# Patient Record
Sex: Male | Born: 1975 | Race: Black or African American | Hispanic: No | Marital: Married | Smoking: Never smoker
Health system: Southern US, Community
[De-identification: ages and names within clinical notes are randomized; demographics above are authoritative.]

## PROBLEM LIST (undated history)

## (undated) DIAGNOSIS — U071 COVID-19: Secondary | ICD-10-CM

## (undated) DIAGNOSIS — T7840XA Allergy, unspecified, initial encounter: Secondary | ICD-10-CM

## (undated) DIAGNOSIS — Z8619 Personal history of other infectious and parasitic diseases: Secondary | ICD-10-CM

## (undated) DIAGNOSIS — I10 Essential (primary) hypertension: Secondary | ICD-10-CM

## (undated) HISTORY — DX: Allergy, unspecified, initial encounter: T78.40XA

## (undated) HISTORY — DX: Personal history of other infectious and parasitic diseases: Z86.19

## (undated) HISTORY — DX: Essential (primary) hypertension: I10

## (undated) HISTORY — DX: COVID-19: U07.1

## (undated) HISTORY — PX: OTHER SURGICAL HISTORY: SHX169

---

## 2006-03-04 DIAGNOSIS — L0293 Carbuncle, unspecified: Secondary | ICD-10-CM | POA: Insufficient documentation

## 2006-03-04 DIAGNOSIS — R030 Elevated blood-pressure reading, without diagnosis of hypertension: Secondary | ICD-10-CM | POA: Insufficient documentation

## 2006-04-18 DIAGNOSIS — M25529 Pain in unspecified elbow: Secondary | ICD-10-CM | POA: Insufficient documentation

## 2006-07-14 DIAGNOSIS — N390 Urinary tract infection, site not specified: Secondary | ICD-10-CM | POA: Insufficient documentation

## 2006-07-14 DIAGNOSIS — R300 Dysuria: Secondary | ICD-10-CM | POA: Insufficient documentation

## 2007-03-08 DIAGNOSIS — R5381 Other malaise: Secondary | ICD-10-CM | POA: Insufficient documentation

## 2007-07-14 DIAGNOSIS — J019 Acute sinusitis, unspecified: Secondary | ICD-10-CM | POA: Insufficient documentation

## 2007-07-14 DIAGNOSIS — H6980 Other specified disorders of Eustachian tube, unspecified ear: Secondary | ICD-10-CM | POA: Insufficient documentation

## 2007-07-14 DIAGNOSIS — H6990 Unspecified Eustachian tube disorder, unspecified ear: Secondary | ICD-10-CM | POA: Insufficient documentation

## 2018-04-04 ENCOUNTER — Encounter: Payer: Self-pay | Admitting: Internal Medicine

## 2018-04-04 ENCOUNTER — Ambulatory Visit: Payer: BLUE CROSS/BLUE SHIELD | Admitting: Internal Medicine

## 2018-04-04 ENCOUNTER — Ambulatory Visit (INDEPENDENT_AMBULATORY_CARE_PROVIDER_SITE_OTHER): Payer: BLUE CROSS/BLUE SHIELD

## 2018-04-04 VITALS — BP 140/90 | HR 74 | Temp 98.1°F | Ht 73.0 in | Wt >= 6400 oz

## 2018-04-04 DIAGNOSIS — Z1329 Encounter for screening for other suspected endocrine disorder: Secondary | ICD-10-CM

## 2018-04-04 DIAGNOSIS — M25572 Pain in left ankle and joints of left foot: Secondary | ICD-10-CM | POA: Insufficient documentation

## 2018-04-04 DIAGNOSIS — E662 Morbid (severe) obesity with alveolar hypoventilation: Secondary | ICD-10-CM | POA: Insufficient documentation

## 2018-04-04 DIAGNOSIS — Z0184 Encounter for antibody response examination: Secondary | ICD-10-CM

## 2018-04-04 DIAGNOSIS — Z1389 Encounter for screening for other disorder: Secondary | ICD-10-CM

## 2018-04-04 DIAGNOSIS — R0683 Snoring: Secondary | ICD-10-CM | POA: Diagnosis not present

## 2018-04-04 DIAGNOSIS — I8393 Asymptomatic varicose veins of bilateral lower extremities: Secondary | ICD-10-CM

## 2018-04-04 DIAGNOSIS — R600 Localized edema: Secondary | ICD-10-CM

## 2018-04-04 DIAGNOSIS — I10 Essential (primary) hypertension: Secondary | ICD-10-CM

## 2018-04-04 DIAGNOSIS — R739 Hyperglycemia, unspecified: Secondary | ICD-10-CM

## 2018-04-04 DIAGNOSIS — Z1159 Encounter for screening for other viral diseases: Secondary | ICD-10-CM

## 2018-04-04 DIAGNOSIS — E559 Vitamin D deficiency, unspecified: Secondary | ICD-10-CM

## 2018-04-04 MED ORDER — PHENTERMINE HCL 37.5 MG PO TABS: 37.5000 mg | ORAL_TABLET | Freq: Every day | ORAL | 0 refills | Status: DC

## 2018-04-04 MED ORDER — HYDROCHLOROTHIAZIDE 12.5 MG PO TABS: 12.5000 mg | ORAL_TABLET | Freq: Every day | ORAL | 0 refills | Status: DC

## 2018-04-04 NOTE — Progress Notes (Signed)
Chief Complaint  Patient presents with  . Annual Exam   Establish care with wife Satara  1. HTN not on meds x 20 years 2. Morbid obesity trying to exercise and eat right but gained about 50 lbs wants to be on adipex again not ready for wt loss surgery  3. Snoring at night and falling asleep as soon as gets home in the pm hard to arouse when asleep 4. Left ankle pain medial no h/o trauma; worse with inversion. He is able to weight bear tried tylenol, ice allergic to nsaids some swelling in ankle as well  5. Varicose veins chronic L>R leg nothing tried    Review of Systems  Constitutional: Negative for weight loss.  HENT: Negative for hearing loss.   Eyes: Negative for blurred vision.  Respiratory: Negative for shortness of breath.   Cardiovascular: Negative for chest pain.  Gastrointestinal: Negative for abdominal pain.  Musculoskeletal: Positive for joint pain.  Skin: Negative for rash.  Neurological: Negative for headaches.  Psychiatric/Behavioral: Negative for depression.   Past Medical History:  Diagnosis Date  . Allergy   . History of chicken pox   . Hypertension    tx'ed in 20s off meds since    Past Surgical History:  Procedure Laterality Date  . left knee arthroscopy     high school    Family History  Problem Relation Age of Onset  . Diabetes Father   . Heart disease Sister        valvular heart disease  . Diabetes Paternal Grandmother   . Hypertension Paternal Grandmother   . Heart disease Paternal Grandmother        valvular heart disease died in late 62s   . Diabetes Paternal Grandfather   . Hypertension Paternal Grandfather    Social History   Socioeconomic History  . Marital status: Married    Spouse name: Not on file  . Number of children: Not on file  . Years of education: Not on file  . Highest education level: Not on file  Occupational History  . Not on file  Social Needs  . Financial resource strain: Not on file  . Food insecurity:   Worry: Not on file    Inability: Not on file  . Transportation needs:    Medical: Not on file    Non-medical: Not on file  Tobacco Use  . Smoking status: Never Smoker  . Smokeless tobacco: Never Used  Substance and Sexual Activity  . Alcohol use: Yes  . Drug use: Not Currently  . Sexual activity: Yes  Lifestyle  . Physical activity:    Days per week: Not on file    Minutes per session: Not on file  . Stress: Not on file  Relationships  . Social connections:    Talks on phone: Not on file    Gets together: Not on file    Attends religious service: Not on file    Active member of club or organization: Not on file    Attends meetings of clubs or organizations: Not on file    Relationship status: Not on file  . Intimate partner violence:    Fear of current or ex partner: Not on file    Emotionally abused: Not on file    Physically abused: Not on file    Forced sexual activity: Not on file  Other Topics Concern  . 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   Allergies  Allergen Reactions  . Nsaids     Hives    No results found for this or any previous visit (from the past 2160 hour(s)). Objective  Body mass index is 53.88 kg/m. Wt Readings from Last 3 Encounters:  04/04/18 (!) 408 lb 6.4 oz (185.2 kg)   Temp Readings from Last 3 Encounters:  04/04/18 98.1 F (36.7 C) (Oral)   BP Readings from Last 3 Encounters:  04/04/18 140/90   Pulse Readings from Last 3 Encounters:  04/04/18 74    Physical Exam Vitals signs and nursing note reviewed.  Constitutional:      Appearance: Normal appearance. He is well-developed and well-groomed. He is morbidly  obese.  HENT:     Head: Normocephalic and atraumatic.     Nose: Nose normal.     Mouth/Throat:     Mouth: Mucous membranes are moist.     Pharynx: Oropharynx is clear.  Eyes:     Conjunctiva/sclera: Conjunctivae normal.     Pupils: Pupils are equal, round, and reactive to light.  Cardiovascular:     Rate and Rhythm: Normal rate and regular rhythm.     Heart sounds: Normal heart sounds. No murmur.     Comments: 1-2 + left lower ext edema   Pulmonary:     Effort: Pulmonary effort is normal.     Breath sounds: Normal breath sounds.  Musculoskeletal:     Left lower leg: Edema present.  Skin:    General: Skin is warm and dry.     Comments: Varicose veins b/l legs   Neurological:     General: No focal deficit present.     Mental Status: He is alert and oriented to person, place, and time. Mental status is at baseline.     Gait: Gait normal.  Psychiatric:        Attention and Perception: Attention and perception normal.        Mood and Affect: Mood and affect normal.        Speech: Speech normal.        Behavior: Behavior normal. Behavior is cooperative.        Thought Content: Thought content normal.        Cognition and Memory: Cognition and memory normal.        Judgment: Judgment normal.     Assessment   1. HTN  2. Morbid obesity BMI 53.88  3. Snoring  4. Left ankle pain medial no h/o trauma; worse with inversion  5. Varicose veins  6. HM Plan   1. Get echo prior PCP had done [facility] in 2018  sch fasting labs  hctz 12.5 mg qd consider add losartan in future  2. adipex 37.5 x 2 months and f/u  3. Consider sleep study in future  4. Xray RICE Consider MRI if pain continues  5. Disc otc compression stockings  6.  Declines flu shot for now had 11/29/16  Tdap 06/2016 he thinks get report [place]   sch fasting labs annual at f/u   Eye exam had 2019  Dentist Dr. Chrisandra Carota  PCP Woody Seller saw 03/2017 Shelly Bombard  Provider: Dr. Olivia Mackie  [facility]

## 2018-04-04 NOTE — Progress Notes (Signed)
Pre visit review using our clinic review tool, if applicable. No additional management support is needed unless otherwise documented below in the visit note. 

## 2018-04-04 NOTE — Patient Instructions (Addendum)
Exercising to Lose Weight Exercise is structured, repetitive physical activity to improve fitness and health. Getting regular exercise is important for everyone. It is especially important if you are overweight. Being overweight increases your risk of heart disease, stroke, diabetes, high blood pressure, and several types of cancer. Reducing your calorie intake and exercising can help you lose weight. Exercise is usually categorized as moderate or vigorous intensity. To lose weight, most people need to do a certain amount of moderate-intensity or vigorous-intensity exercise each week. Moderate-intensity exercise  Moderate-intensity exercise is any activity that gets you moving enough to burn at least three times more energy (calories) than if you were sitting. Examples of moderate exercise include:  Walking a mile in 15 minutes.  Doing light yard work.  Biking at an easy pace. Most people should get at least 150 minutes (2 hours and 30 minutes) a week of moderate-intensity exercise to maintain their body weight. Vigorous-intensity exercise Vigorous-intensity exercise is any activity that gets you moving enough to burn at least six times more calories than if you were sitting. When you exercise at this intensity, you should be working hard enough that you are not able to carry on a conversation. Examples of vigorous exercise include:  Running.  Playing a team sport, such as football, basketball, and soccer.  Jumping rope. Most people should get at least 75 minutes (1 hour and 15 minutes) a week of vigorous-intensity exercise to maintain their body weight. How can exercise affect me? When you exercise enough to burn more calories than you eat, you lose weight. Exercise also reduces body fat and builds muscle. The more muscle you have, the more calories you burn. Exercise also:  Improves mood.  Reduces stress and tension.  Improves your overall fitness, flexibility, and  endurance.  Increases bone strength. The amount of exercise you need to lose weight depends on:  Your age.  The type of exercise.  Any health conditions you have.  Your overall physical ability. Talk to your health care provider about how much exercise you need and what types of activities are safe for you. What actions can I take to lose weight? Nutrition   Make changes to your diet as told by your health care provider or diet and nutrition specialist (dietitian). This may include: ? Eating fewer calories. ? Eating more protein. ? Eating less unhealthy fats. ? Eating a diet that includes fresh fruits and vegetables, whole grains, low-fat dairy products, and lean protein. ? Avoiding foods with added fat, salt, and sugar.  Drink plenty of water while you exercise to prevent dehydration or heat stroke. Activity  Choose an activity that you enjoy and set realistic goals. Your health care provider can help you make an exercise plan that works for you.  Exercise at a moderate or vigorous intensity most days of the week. ? The intensity of exercise may vary from person to person. You can tell how intense a workout is for you by paying attention to your breathing and heartbeat. Most people will notice their breathing and heartbeat get faster with more intense exercise.  Do resistance training twice each week, such as: ? Push-ups. ? Sit-ups. ? Lifting weights. ? Using resistance bands.  Getting short amounts of exercise can be just as helpful as long structured periods of exercise. If you have trouble finding time to exercise, try to include exercise in your daily routine. ? Get up, stretch, and walk around every 30 minutes throughout the day. ? Go for a  walk during your lunch break. ? Park your car farther away from your destination. ? If you take public transportation, get off one stop early and walk the rest of the way. ? Make phone calls while standing up and walking around. ? Take the  stairs instead of elevators or escalators.  Wear comfortable clothes and shoes with good support.  Do not exercise so much that you hurt yourself, feel dizzy, or get very short of breath. Where to find more information  [place] Department of Health and Human Services: www.hhs.gov  Centers for Disease Control and Prevention (CDC): www.cdc.gov Contact a health care provider:  Before starting a new exercise program.  If you have questions or concerns about your weight.  If you have a medical problem that keeps you from exercising. Get help right away if you have any of the following while exercising:  Injury.  Dizziness.  Difficulty breathing or shortness of breath that does not go away when you stop exercising.  Chest pain.  Rapid heartbeat. Summary  Being overweight increases your risk of heart disease, stroke, diabetes, high blood pressure, and several types of cancer.  Losing weight happens when you burn more calories than you eat.  Reducing the amount of calories you eat in addition to getting regular moderate or vigorous exercise each week helps you lose weight. This information is not intended to replace advice given to you by your health care provider. Make sure you discuss any questions you have with your health care provider. Document Released: 03/20/2010 Document Revised: 02/28/2017 Document Reviewed: 02/28/2017 Elsevier Interactive Patient Education  2019 [employer]  DASH Eating Plan DASH stands for "Dietary Approaches to Stop Hypertension." The DASH eating plan is a healthy eating plan that has been shown to reduce high blood pressure (hypertension). It may also reduce your risk for type 2 diabetes, heart disease, and stroke. The DASH eating plan may also help with weight loss. What are tips for following this plan?  General guidelines  Avoid eating more than 2,300 mg (milligrams) of salt (sodium) a day. If you have hypertension, you may need to reduce your  sodium intake to 1,500 mg a day.  Limit alcohol intake to no more than 1 drink a day for nonpregnant women and 2 drinks a day for men. One drink equals 12 oz of beer, 5 oz of wine, or 1 oz of hard liquor.  Work with your health care provider to maintain a healthy body weight or to lose weight. Ask what an ideal weight is for you.  Get at least 30 minutes of exercise that causes your heart to beat faster (aerobic exercise) most days of the week. Activities may include walking, swimming, or biking.  Work with your health care provider or diet and nutrition specialist (dietitian) to adjust your eating plan to your individual calorie needs. Reading food labels   Check food labels for the amount of sodium per serving. Choose foods with less than 5 percent of the Daily Value of sodium. Generally, foods with less than 300 mg of sodium per serving fit into this eating plan.  To find whole grains, look for the word "whole" as the first word in the ingredient list. Shopping  Buy products labeled as "low-sodium" or "no salt added."  Buy fresh foods. Avoid canned foods and premade or frozen meals. Cooking  Avoid adding salt when cooking. Use salt-free seasonings or herbs instead of table salt or sea salt. Check with your health care provider or pharmacist   before using salt substitutes.  Do not fry foods. Cook foods using healthy methods such as baking, boiling, grilling, and broiling instead.  Cook with heart-healthy oils, such as olive, canola, soybean, or sunflower oil. Meal planning  Eat a balanced diet that includes: ? 5 or more servings of fruits and vegetables each day. At each meal, try to fill half of your plate with fruits and vegetables. ? Up to 6-8 servings of whole grains each day. ? Less than 6 oz of lean meat, poultry, or fish each day. A 3-oz serving of meat is about the same size as a deck of cards. One egg equals 1 oz. ? 2 servings of low-fat dairy each day. ? A serving of  nuts, seeds, or beans 5 times each week. ? Heart-healthy fats. Healthy fats called Omega-3 fatty acids are found in foods such as flaxseeds and coldwater fish, like sardines, salmon, and mackerel.  Limit how much you eat of the following: ? Canned or prepackaged foods. ? Food that is high in trans fat, such as fried foods. ? Food that is high in saturated fat, such as fatty meat. ? Sweets, desserts, sugary drinks, and other foods with added sugar. ? Full-fat dairy products.  Do not salt foods before eating.  Try to eat at least 2 vegetarian meals each week.  Eat more home-cooked food and less restaurant, buffet, and fast food.  When eating at a restaurant, ask that your food be prepared with less salt or no salt, if possible. What foods are recommended? The items listed may not be a complete list. Talk with your dietitian about what dietary choices are best for you. Grains Whole-grain or whole-wheat bread. Whole-grain or whole-wheat pasta. Brown rice. Oatmeal. Quinoa. Bulgur. Whole-grain and low-sodium cereals. Pita bread. Low-fat, low-sodium crackers. Whole-wheat flour tortillas. Vegetables Fresh or frozen vegetables (raw, steamed, roasted, or grilled). Low-sodium or reduced-sodium tomato and vegetable juice. Low-sodium or reduced-sodium tomato sauce and tomato paste. Low-sodium or reduced-sodium canned vegetables. Fruits All fresh, dried, or frozen fruit. Canned fruit in natural juice (without added sugar). Meat and other protein foods Skinless chicken or turkey. Ground chicken or turkey. Pork with fat trimmed off. Fish and seafood. Egg whites. Dried beans, peas, or lentils. Unsalted nuts, nut butters, and seeds. Unsalted canned beans. Lean cuts of beef with fat trimmed off. Low-sodium, lean deli meat. Dairy Low-fat (1%) or fat-free (skim) milk. Fat-free, low-fat, or reduced-fat cheeses. Nonfat, low-sodium ricotta or cottage cheese. Low-fat or nonfat yogurt. Low-fat, low-sodium cheese.  Fats and oils Soft margarine without trans fats. Vegetable oil. Low-fat, reduced-fat, or light mayonnaise and salad dressings (reduced-sodium). Canola, safflower, olive, soybean, and sunflower oils. Avocado. Seasoning and other foods Herbs. Spices. Seasoning mixes without salt. Unsalted popcorn and pretzels. Fat-free sweets. What foods are not recommended? The items listed may not be a complete list. Talk with your dietitian about what dietary choices are best for you. Grains Baked goods made with fat, such as croissants, muffins, or some breads. Dry pasta or rice meal packs. Vegetables Creamed or fried vegetables. Vegetables in a cheese sauce. Regular canned vegetables (not low-sodium or reduced-sodium). Regular canned tomato sauce and paste (not low-sodium or reduced-sodium). Regular tomato and vegetable juice (not low-sodium or reduced-sodium). Pickles. Olives. Fruits Canned fruit in a light or heavy syrup. Fried fruit. Fruit in cream or butter sauce. Meat and other protein foods Fatty cuts of meat. Ribs. Fried meat. Bacon. Sausage. Bologna and other processed lunch meats. Salami. Fatback. Hotdogs. Bratwurst. Salted nuts   and seeds. Canned beans with added salt. Canned or smoked fish. Whole eggs or egg yolks. Chicken or [place] with skin. Dairy Whole or 2% milk, cream, and half-and-half. Whole or full-fat cream cheese. Whole-fat or sweetened yogurt. Full-fat cheese. Nondairy creamers. Whipped toppings. Processed cheese and cheese spreads. Fats and oils Butter. Stick margarine. Lard. Shortening. Ghee. Bacon fat. Tropical oils, such as coconut, palm kernel, or palm oil. Seasoning and other foods Salted popcorn and pretzels. Onion salt, garlic salt, seasoned salt, table salt, and sea salt. Worcestershire sauce. Tartar sauce. Barbecue sauce. Teriyaki sauce. Soy sauce, including reduced-sodium. Steak sauce. Canned and packaged gravies. Fish sauce. Oyster sauce. Cocktail  sauce. Horseradish that you find on the shelf. Ketchup. Mustard. Meat flavorings and tenderizers. Bouillon cubes. Hot sauce and Tabasco sauce. Premade or packaged marinades. Premade or packaged taco seasonings. Relishes. Regular salad dressings. Where to find more information:  National Heart, Lung, and [place]: https://wilson-eaton.com/  American Heart Association: www.heart.org Summary  The DASH eating plan is a healthy eating plan that has been shown to reduce high blood pressure (hypertension). It may also reduce your risk for type 2 diabetes, heart disease, and stroke.  With the DASH eating plan, you should limit salt (sodium) intake to 2,300 mg a day. If you have hypertension, you may need to reduce your sodium intake to 1,500 mg a day.  When on the DASH eating plan, aim to eat more fresh fruits and vegetables, whole grains, lean proteins, low-fat dairy, and heart-healthy fats.  Work with your health care provider or diet and nutrition specialist (dietitian) to adjust your eating plan to your individual calorie needs. This information is not intended to replace advice given to you by your health care provider. Make sure you discuss any questions you have with your health care provider. Document Released: 02/04/2011 Document Revised: 02/09/2016 Document Reviewed: 02/09/2016 Elsevier Interactive Patient Education  2019 [employer] Inc.  Varicose Veins Varicose veins are veins that have become enlarged, bulged, and twisted. They most often appear in the legs. What are the causes? This condition is caused by damage to the valves in the vein. These valves help blood return to your heart. When they are damaged and they stop working properly, blood may flow backward and back up in the veins near the skin, causing the veins to get larger and appear twisted. The condition can result from any issue that causes blood to back up, like pregnancy, prolonged standing, or obesity. What increases the  risk? This condition is more likely to develop in people who are:  On their feet a lot.  Pregnant.  Overweight. What are the signs or symptoms? Symptoms of this condition include:  Bulging, twisted, and bluish veins.  A feeling of heaviness. This may be worse at the end of the day.  Leg pain. This may be worse at the end of the day.  Swelling in the leg.  Changes in skin color over the veins. How is this diagnosed? This condition may be diagnosed based on your symptoms, a physical exam, and an ultrasound test. How is this treated? Treatment for this condition may involve:  Avoiding sitting or standing in one position for long periods of time.  Wearing compression stockings. These stockings help to prevent blood clots and reduce swelling in the legs.  Raising (elevating) the legs when resting.  Losing weight.  Exercising regularly. If you have persistent symptoms or want to improve the way your varicose veins look, you may choose to have a  procedure to close the varicose veins off or to remove them. Treatments to close off the veins include:  Sclerotherapy. In this treatment, a solution is injected into a vein to close it off.  Laser treatment. In this treatment, the vein is heated with a laser to close it off.  Radiofrequency vein ablation. In this treatment, an electrical current produced by radio waves is used to close off the vein. Treatments to remove the veins include:  Phlebectomy. In this treatment, the veins are removed through small incisions made over the veins.  Vein ligation and stripping. In this treatment, incisions are made over the veins. The veins are then removed after being tied (ligated) with stitches (sutures). Follow these instructions at home: Activity  Walk as much as possible. Walking increases blood flow. This helps blood return to the heart and takes pressure off your veins. It also increases your cardiovascular strength.  Follow your  health care provider's instructions about exercising.  Do not stand or sit in one position for a long period of time.  Do not sit with your legs crossed.  Rest with your legs raised during the day. General instructions   Follow any diet instructions given to you by your health care provider.  Wear compression stockings as directed by your health care provider. Do not wear other kinds of tight clothing around your legs, pelvis, or waist.  Elevate your legs at night to above the level of your heart.  If you get a cut in the skin over the varicose vein and the vein bleeds: ? Lie down with your leg raised. ? Apply firm pressure to the cut with a clean cloth until the bleeding stops. ? Place a bandage (dressing) on the cut. Contact a health care provider if:  The skin around your varicose veins starts to break down.  You have pain, redness, tenderness, or hard swelling over a vein.  You are uncomfortable because of pain.  You get a cut in the skin over a varicose vein and it will not stop bleeding. Summary  Varicose veins are veins that have become enlarged, bulged, and twisted. They most often appear in the legs.  This condition is caused by damage to the valves in the vein. These valves help blood return to your heart.  Treatment for this condition includes frequent movements, wearing compression stockings, losing weight, and exercising regularly. In some cases, procedures are done to close off or remove the veins.  Treatment for this condition may include wearing compression stockings, elevating the legs, losing weight, and engaging in regular activity. In some cases, procedures are done to close off or remove the veins. This information is not intended to replace advice given to you by your health care provider. Make sure you discuss any questions you have with your health care provider. Document Released: 11/25/2004 Document Revised: 03/10/2016 Document Reviewed:  03/10/2016 Elsevier Interactive Patient Education  2019 [employer]  Ankle Pain The ankle joint holds your body weight and allows you to move around. Ankle pain can occur on either side or the back of one ankle or both ankles. Ankle pain may be sharp and burning or dull and aching. There may be tenderness, stiffness, redness, or warmth around the ankle. Many things can cause ankle pain, including an injury to the area and overuse of the ankle. Follow these instructions at home: Activity  Rest your ankle as told by your health care provider. Avoid any activities that cause ankle pain.  Do not  use the injured limb to support your body weight until your health care provider says that you can. Use crutches as told by your health care provider.  Do exercises as told by your health care provider.  Ask your health care provider when it is safe to drive if you have a brace on your ankle. If you have a brace:  Wear the brace as told by your health care provider. Remove it only as told by your health care provider.  Loosen the brace if your toes tingle, become numb, or turn cold and blue.  Keep the brace clean.  If the brace is not waterproof: ? Do not let it get wet. ? Cover it with a watertight covering when you take a bath or shower. If you were given an elastic bandage:   Remove it when you take a bath or a shower.  Try not to move your ankle very much, but wiggle your toes from time to time. This helps to prevent swelling.  Adjust the bandage to make it more comfortable if it feels too tight.  Loosen the bandage if you have numbness or tingling in your foot or if your foot turns cold and blue. Managing pain, stiffness, and swelling   If directed, put ice on the painful area. ? If you have a removable brace or elastic bandage, remove it as told by your health care provider. ? Put ice in a plastic bag. ? Place a towel between your skin and the bag. ? Leave the ice on for 20  minutes, 2-3 times a day.  Move your toes often to avoid stiffness and to lessen swelling.  Raise (elevate) your ankle above the level of your heart while you are sitting or lying down. General instructions  Record information about your pain. Writing down the following may be helpful for you and your health care provider: ? How often you have ankle pain. ? Where the pain is located. ? What the pain feels like.  If treatment involves wearing a prescribed shoe or insole, make sure you wear it correctly and for as long as told by your health care provider.  Take over-the-counter and prescription medicines only as told by your health care provider.  Keep all follow-up visits as told by your health care provider. This is important. Contact a health care provider if:  Your pain gets worse.  Your pain is not relieved with medicines.  You have a fever or chills.  You are having more trouble with walking.  You have new symptoms. Get help right away if:  Your foot, leg, toes, or ankle: ? Tingles or becomes numb. ? Becomes swollen. ? Turns pale or blue. Summary  Ankle pain can occur on either side or the back of one ankle or both ankles.  Ankle pain may be sharp and burning or dull and aching.  Rest your ankle as told by your health care provider. If told, apply ice to the area.  Take over-the-counter and prescription medicines only as told by your health care provider. This information is not intended to replace advice given to you by your health care provider. Make sure you discuss any questions you have with your health care provider. Document Released: 08/05/2009 Document Revised: 08/24/2017 Document Reviewed: 08/24/2017 Elsevier Interactive Patient Education  2019 [employer].

## 2018-04-05 ENCOUNTER — Other Ambulatory Visit: Payer: BLUE CROSS/BLUE SHIELD

## 2018-04-14 ENCOUNTER — Other Ambulatory Visit (INDEPENDENT_AMBULATORY_CARE_PROVIDER_SITE_OTHER): Payer: BLUE CROSS/BLUE SHIELD

## 2018-04-14 ENCOUNTER — Other Ambulatory Visit: Payer: Self-pay | Admitting: Internal Medicine

## 2018-04-14 DIAGNOSIS — Z1329 Encounter for screening for other suspected endocrine disorder: Secondary | ICD-10-CM

## 2018-04-14 DIAGNOSIS — E559 Vitamin D deficiency, unspecified: Secondary | ICD-10-CM

## 2018-04-14 DIAGNOSIS — Z1389 Encounter for screening for other disorder: Secondary | ICD-10-CM

## 2018-04-14 DIAGNOSIS — Z1159 Encounter for screening for other viral diseases: Secondary | ICD-10-CM

## 2018-04-14 DIAGNOSIS — I10 Essential (primary) hypertension: Secondary | ICD-10-CM | POA: Diagnosis not present

## 2018-04-14 DIAGNOSIS — M25572 Pain in left ankle and joints of left foot: Secondary | ICD-10-CM | POA: Diagnosis not present

## 2018-04-14 DIAGNOSIS — R739 Hyperglycemia, unspecified: Secondary | ICD-10-CM | POA: Diagnosis not present

## 2018-04-14 DIAGNOSIS — Z0184 Encounter for antibody response examination: Secondary | ICD-10-CM | POA: Diagnosis not present

## 2018-04-14 LAB — COMPREHENSIVE METABOLIC PANEL
ALT: 35 U/L (ref 0–53)
AST: 25 U/L (ref 0–37)
Albumin: 4.3 g/dL (ref 3.5–5.2)
Alkaline Phosphatase: 95 U/L (ref 39–117)
BUN: 13 mg/dL (ref 6–23)
CO2: 30 mEq/L (ref 19–32)
Calcium: 9.4 mg/dL (ref 8.4–10.5)
Chloride: 99 mEq/L (ref 96–112)
Creatinine, Ser: 1.01 mg/dL (ref 0.40–1.50)
GFR: 80.83 mL/min (ref >60.00)
Glucose, Bld: 97 mg/dL (ref 70–99)
Potassium: 4 mEq/L (ref 3.5–5.1)
Sodium: 138 mEq/L (ref 135–145)
Total Bilirubin: 0.5 mg/dL (ref 0.2–1.2)
Total Protein: 8.1 g/dL (ref 6.0–8.3)

## 2018-04-14 LAB — CBC WITH DIFFERENTIAL/PLATELET
Basophils Absolute: 0 10*3/uL (ref 0.0–0.1)
Basophils Relative: 0.5 % (ref 0.0–3.0)
Eosinophils Absolute: 0.1 10*3/uL (ref 0.0–0.7)
Eosinophils Relative: 1.3 % (ref 0.0–5.0)
HCT: 42 % (ref 39.0–52.0)
Hemoglobin: 14.4 g/dL (ref 13.0–17.0)
Lymphocytes Relative: 28.5 % (ref 12.0–46.0)
Lymphs Abs: 1.7 10*3/uL (ref 0.7–4.0)
MCHC: 34.2 g/dL (ref 30.0–36.0)
MCV: 85.1 fl (ref 78.0–100.0)
Monocytes Absolute: 0.5 10*3/uL (ref 0.1–1.0)
Monocytes Relative: 8.3 % (ref 3.0–12.0)
Neutro Abs: 3.8 10*3/uL (ref 1.4–7.7)
Neutrophils Relative %: 61.4 % (ref 43.0–77.0)
Platelets: 264 10*3/uL (ref 150.0–400.0)
RBC: 4.94 Mil/uL (ref 4.22–5.81)
RDW: 13.4 % (ref 11.5–15.5)
WBC: 6.1 10*3/uL (ref 4.0–10.5)

## 2018-04-14 LAB — LIPID PANEL
Cholesterol: 123 mg/dL (ref 0–200)
HDL: 30.9 mg/dL — ABNORMAL LOW (ref >39.00)
LDL Cholesterol: 77 mg/dL (ref 0–99)
NonHDL: 92.31
Total CHOL/HDL Ratio: 4
Triglycerides: 77 mg/dL (ref 0.0–149.0)
VLDL: 15.4 mg/dL (ref 0.0–40.0)

## 2018-04-14 LAB — VITAMIN D 25 HYDROXY (VIT D DEFICIENCY, FRACTURES): VITD: 10.6 ng/mL — ABNORMAL LOW (ref 30.00–100.00)

## 2018-04-14 LAB — TSH: TSH: 1.63 u[IU]/mL (ref 0.35–4.50)

## 2018-04-14 LAB — HEMOGLOBIN A1C: Hgb A1c MFr Bld: 6.2 % (ref 4.6–6.5)

## 2018-04-14 LAB — URIC ACID: Uric Acid, Serum: 7.1 mg/dL (ref 4.0–7.8)

## 2018-04-14 MED ORDER — CHOLECALCIFEROL 1.25 MG (50000 UT) PO CAPS: 50000.0000 [IU] | ORAL_CAPSULE | ORAL | 1 refills | Status: DC

## 2018-04-14 NOTE — Addendum Note (Signed)
Addended by: Arby Barrette on: 04/14/2018 08:45 AM   Modules accepted: Orders

## 2018-04-15 LAB — URINALYSIS, ROUTINE W REFLEX MICROSCOPIC
Bilirubin, UA: NEGATIVE
Glucose, UA: NEGATIVE
Ketones, UA: NEGATIVE
Leukocytes, UA: NEGATIVE
Nitrite, UA: NEGATIVE
Protein, UA: NEGATIVE
RBC, UA: NEGATIVE
Specific Gravity, UA: 1.019 (ref 1.005–1.030)
Urobilinogen, Ur: 0.2 mg/dL (ref 0.2–1.0)
pH, UA: 5.5 (ref 5.0–7.5)

## 2018-04-17 LAB — HEPATITIS B SURFACE ANTIBODY, QUANTITATIVE: Hep B S AB Quant (Post): <5 m[IU]/mL — ABNORMAL LOW (ref >=10)

## 2018-04-17 LAB — MEASLES/MUMPS/RUBELLA IMMUNITY
Mumps IgG: 32.9 AU/mL
Rubella: 7.12 index
Rubeola IgG: 188 AU/mL

## 2018-04-28 ENCOUNTER — Telehealth: Payer: Self-pay

## 2018-04-28 NOTE — Telephone Encounter (Signed)
Called and spoke with patient.  Informed patient that Phentermine medication has been approved.  Patient aware that he can pick up prescription at [facility].

## 2018-05-23 ENCOUNTER — Other Ambulatory Visit: Payer: Self-pay | Admitting: Internal Medicine

## 2018-05-23 MED ORDER — PHENTERMINE HCL 37.5 MG PO TABS: 37.5000 mg | ORAL_TABLET | Freq: Every day | ORAL | 0 refills | Status: DC

## 2018-05-30 ENCOUNTER — Other Ambulatory Visit: Payer: Self-pay

## 2018-05-30 ENCOUNTER — Ambulatory Visit (INDEPENDENT_AMBULATORY_CARE_PROVIDER_SITE_OTHER): Payer: BLUE CROSS/BLUE SHIELD | Admitting: Internal Medicine

## 2018-05-30 ENCOUNTER — Encounter: Payer: Self-pay | Admitting: Internal Medicine

## 2018-05-30 DIAGNOSIS — R600 Localized edema: Secondary | ICD-10-CM | POA: Diagnosis not present

## 2018-05-30 DIAGNOSIS — M545 Low back pain, unspecified: Secondary | ICD-10-CM | POA: Insufficient documentation

## 2018-05-30 DIAGNOSIS — I10 Essential (primary) hypertension: Secondary | ICD-10-CM | POA: Diagnosis not present

## 2018-05-30 DIAGNOSIS — E559 Vitamin D deficiency, unspecified: Secondary | ICD-10-CM

## 2018-05-30 MED ORDER — HYDROCHLOROTHIAZIDE 12.5 MG PO TABS: 12.5000 mg | ORAL_TABLET | Freq: Every day | ORAL | 3 refills | Status: DC

## 2018-05-30 NOTE — Patient Instructions (Signed)
Back Exercises  The following exercises strengthen the muscles that help to support the back. They also help to keep the lower back flexible. Doing these exercises can help to prevent back pain or lessen existing pain.  If you have back pain or discomfort, try doing these exercises 2-3 times each day or as told by your health care provider. When the pain goes away, do them once each day, but increase the number of times that you repeat the steps for each exercise (do more repetitions). If you do not have back pain or discomfort, do these exercises once each day or as told by your health care provider.  Exercises  Single Knee to Chest  Repeat these steps 3-5 times for each leg:  1. Lie on your back on a firm bed or the floor with your legs extended.  2. Bring one knee to your chest. Your other leg should stay extended and in contact with the floor.  3. Hold your knee in place by grabbing your knee or thigh.  4. Pull on your knee until you feel a gentle stretch in your lower back.  5. Hold the stretch for 10-30 seconds.  6. Slowly release and straighten your leg.  Pelvic Tilt  Repeat these steps 5-10 times:  1. Lie on your back on a firm bed or the floor with your legs extended.  2. Bend your knees so they are pointing toward the ceiling and your feet are flat on the floor.  3. Tighten your lower abdominal muscles to press your lower back against the floor. This motion will tilt your pelvis so your tailbone points up toward the ceiling instead of pointing to your feet or the floor.  4. With gentle tension and even breathing, hold this position for 5-10 seconds.  Cat-Cow  Repeat these steps until your lower back becomes more flexible:  1. Get into a hands-and-knees position on a firm surface. Keep your hands under your shoulders, and keep your knees under your hips. You may place padding under your knees for comfort.  2. Let your head hang down, and point your tailbone toward the floor so your lower back becomes  rounded like the back of a cat.  3. Hold this position for 5 seconds.  4. Slowly lift your head and point your tailbone up toward the ceiling so your back forms a sagging arch like the back of a cow.  5. Hold this position for 5 seconds.    Press-Ups  Repeat these steps 5-10 times:  1. Lie on your abdomen (face-down) on the floor.  2. Place your palms near your head, about shoulder-width apart.  3. While you keep your back as relaxed as possible and keep your hips on the floor, slowly straighten your arms to raise the top half of your body and lift your shoulders. Do not use your back muscles to raise your upper torso. You may adjust the placement of your hands to make yourself more comfortable.  4. Hold this position for 5 seconds while you keep your back relaxed.  5. Slowly return to lying flat on the floor.    Bridges  Repeat these steps 10 times:  1. Lie on your back on a firm surface.  2. Bend your knees so they are pointing toward the ceiling and your feet are flat on the floor.  3. Tighten your buttocks muscles and lift your buttocks off of the floor until your waist is at almost the same height as   your knees. You should feel the muscles working in your buttocks and the back of your thighs. If you do not feel these muscles, slide your feet 1-2 inches farther away from your buttocks.  4. Hold this position for 3-5 seconds.  5. Slowly lower your hips to the starting position, and allow your buttocks muscles to relax completely.  If this exercise is too easy, try doing it with your arms crossed over your chest.  Abdominal Crunches  Repeat these steps 5-10 times:  1. Lie on your back on a firm bed or the floor with your legs extended.  2. Bend your knees so they are pointing toward the ceiling and your feet are flat on the floor.  3. Cross your arms over your chest.  4. Tip your chin slightly toward your chest without bending your neck.  5. Tighten your abdominal muscles and slowly raise your trunk (torso) high  enough to lift your shoulder blades a tiny bit off of the floor. Avoid raising your torso higher than that, because it can put too much stress on your low back and it does not help to strengthen your abdominal muscles.  6. Slowly return to your starting position.  Back Lifts  Repeat these steps 5-10 times:  1. Lie on your abdomen (face-down) with your arms at your sides, and rest your forehead on the floor.  2. Tighten the muscles in your legs and your buttocks.  3. Slowly lift your chest off of the floor while you keep your hips pressed to the floor. Keep the back of your head in line with the curve in your back. Your eyes should be looking at the floor.  4. Hold this position for 3-5 seconds.  5. Slowly return to your starting position.  Contact a health care provider if:   Your back pain or discomfort gets much worse when you do an exercise.   Your back pain or discomfort does not lessen within 2 hours after you exercise.  If you have any of these problems, stop doing these exercises right away. Do not do them again unless your health care provider says that you can.  Get help right away if:   You develop sudden, severe back pain. If this happens, stop doing the exercises right away. Do not do them again unless your health care provider says that you can.  This information is not intended to replace advice given to you by your health care provider. Make sure you discuss any questions you have with your health care provider.  Document Released: 03/25/2004 Document Revised: 06/21/2017 Document Reviewed: 04/11/2014  Elsevier Interactive Patient Education  2019 [employer]          Core Strength Exercises    Core exercises help to build strength in the muscles between your ribs and your hips (abdominal muscles). These muscles help to support your body and keep your spine stable. It is important to maintain strength in your core to prevent injury and pain.  Some activities, such as yoga and Pilates, can help to  strengthen core muscles. You can also strengthen core muscles with exercises at home. It is important to talk to your health care provider before you start a new exercise routine.  What are the benefits of core strength exercises?  Core strength exercises can:   Reduce back pain.   Help to rebuild strength after a back or spine injury.   Help to prevent injury during physical activity, especially injuries to the   back and knees.  How to do core strength exercises  Repeat these exercises 10-15 times, or until you are tired. Do exercises exactly as told by your health care provider and adjust them as directed. It is normal to feel mild stretching, pulling, tightness, or discomfort as you do these exercises. If you feel any pain while doing these exercises, stop. If your pain continues or gets worse when doing core exercises, contact your health care provider.  You may want to use a padded yoga or exercise mat for strength exercises that are done on the floor.  Bridging    1. Lie on your back on a firm surface with your knees bent and your feet flat on the floor.  2. Raise your hips so that your knees, hips, and shoulders form a straight line together. Keep your abdominal muscles tight.  3. Hold this position for 3-5 seconds.  4. Slowly lower your hips to the starting position.  5. Let your muscles relax completely between repetitions.  Single-leg bridge  1. Lie on your back on a firm surface with your knees bent and your feet flat on the floor.  2. Raise your hips so that your knees, hips, and shoulders form a straight line together. Keep your abdominal muscles tight.  3. Lift one foot off the floor, then completely straighten that leg.  4. Hold this position for 3-5 seconds.  5. Put the straight leg back down in the bent position.  6. Slowly lower your hips to the starting position.  7. Repeat these steps using your other leg.  Side bridge  1. Lie on your side with your knees bent. Prop yourself up on the elbow  that is near the floor.  2. Using your abdominal muscles and your elbow that is on the floor, raise your body off the floor. Raise your hip so that your shoulder, hip, and foot form a straight line together.  3. Hold this position for 10 seconds. Keep your head and neck raised and away from your shoulder (in their normal, neutral position). Keep your abdominal muscles tight.  4. Slowly lower your hip to the starting position.  5. Repeat and try to hold this position longer, working your way up to 30 seconds.  Abdominal crunch  1. Lie on your back on a firm surface. Bend your knees and keep your feet flat on the floor.  2. Cross your arms over your chest.  3. Without bending your neck, tip your chin slightly toward your chest.  4. Tighten your abdominal muscles as you lift your chest just high enough to lift your shoulder blades off of the floor. Do not hold your breath. You can do this with short lifts or long lifts.  5. Slowly return to the starting position.  Bird dog  1. Get on your hands and knees, with your legs shoulder-width apart and your arms under your shoulders. Keep your back straight.  2. Tighten your abdominal muscles.  3. Raise one of your legs off the floor and straighten it. Try to keep it parallel to the floor.  4. Slowly lower your leg to the starting position.  5. Raise one of your arms off the floor and straighten it. Try to keep it parallel to the floor.  6. Slowly lower your arm to the starting position.  7. Repeat with the other arm and leg. If possible, try raising a leg and arm at the same time, on opposite sides of the body. For   example, raise your left hand and your right leg.  Plank  1. Lie on your belly.  2. Prop up your body onto your forearms and your feet, keeping your legs straight. Your body should make a straight line between your shoulders and feet.  3. Hold this position for 10 seconds while keeping your abdominal muscles tight.  4. Lower your body to the starting  position.  5. Repeat and try to hold this position longer, working your way up to 30 seconds.  Cross-core strengthening  1. Stand with your feet shoulder-width apart.  2. Hold a ball out in front of you. Keep your arms straight.  3. Tighten your abdominal muscles and slowly rotate at your waist from side to side. Keep your feet flat.  4. Once you are comfortable, try repeating this exercise with a heavier ball.  Top core strengthening  1. Stand about 18 inches (46 cm) in front of a wall, with your back to the wall.  2. Keep your feet flat and shoulder-width apart.  3. Tighten your abdominal muscles.  4. Bend your hips and knees.  5. Slowly reach between your legs to touch the wall behind you.  6. Slowly stand back up.  7. Raise your arms over your head and reach behind you.  8. Return to the starting position.  General tips   Do not do any exercises that cause pain. If you have pain while exercising, talk to your health care provider.   Always stretch before and after doing these exercises. This can help prevent injury.   Maintain a healthy weight. Ask your health care provider what weight is healthy for you.  Contact a health care provider if:   You have back pain that gets worse or does not go away.   You feel pain while doing core strength exercises.  Get help right away if:   You have severe pain that does not get better with medicine.  Summary   Core exercises help to build strength in the muscles between your ribs and your waist.   Core muscles help to support your body and keep your spine stable.   Some activities, such as yoga and Pilates, can help to strengthen core muscles.   Core strength exercises can help back pain and can prevent injury.   If you feel any pain while doing core strength exercises, stop.  This information is not intended to replace advice given to you by your health care provider. Make sure you discuss any questions you have with your health care provider.  Document Released:  07/07/2016 Document Revised: 07/07/2016 Document Reviewed: 07/07/2016  Elsevier Interactive Patient Education  2019 [employer]vier Inc[employer]

## 2018-05-30 NOTE — Progress Notes (Signed)
Virtual Visit via Video Note  I connected with Saddie Benders on 05/30/18 at  3:30 PM EDT by a video enabled telemedicine application and verified that I am speaking with the correct person using two identifiers.  Location patient: home Location provider:work Persons participating in the virtual visit: patient, provider, wife in background  I discussed the limitations of evaluation and management by telemedicine and the availability of in person appointments. The patient expressed understanding and agreed to proceed.   HPI: 1. HTN not checking at home on hctz 12.5 qd but stopped 2 days ago this helped with leg edema esp on the left this is resolved for now he wants to know if should take hctz long term  2. Obesity goal wt is 290 currently he is 388/9 per his scale at home he is trying to eat healthy and work out on adipex 37.5 3rd month of 4 has to pick up new Rx from [facility]  3. Low back pain in "flexors" or "gluts" w/o radiation x 2 x back has caught and felt like spasm had trouble walking he has not had back pain before this nothing tried. No radiation of pain down his legs  4. Tdap pt due but will hold for now after COVID 19  5. Vitamin D def taking weekly 50K IU D3 not noticed any difference good or bad   ROS: See pertinent positives and negatives per HPI.  Past Medical History:  Diagnosis Date  . Allergy   . History of chicken pox   . Hypertension    tx'ed in 20s off meds since     Past Surgical History:  Procedure Laterality Date  . left knee arthroscopy     high school     Family History  Problem Relation Age of Onset  . Diabetes Father   . Heart disease Sister        valvular heart disease  . Diabetes Paternal Grandmother   . Hypertension Paternal Grandmother   . Heart disease Paternal Grandmother        valvular heart disease died in late 24s   . Diabetes Paternal Grandfather   . Hypertension Paternal Grandfather     SOCIAL HX: married with kids    Current  Outpatient Medications:  .  Cholecalciferol 1.25 MG (50000 UT) capsule, Take 1 capsule (50,000 Units total) by mouth once a week., Disp: 13 capsule, Rfl: 1 .  hydrochlorothiazide (HYDRODIURIL) 12.5 MG tablet, Take 1 tablet (12.5 mg total) by mouth daily., Disp: 45 tablet, Rfl: 0 .  phentermine (ADIPEX-P) 37.5 MG tablet, Take 1 tablet (37.5 mg total) by mouth daily before breakfast. Rx 2/2 due to fill 06/03/2018, Disp: 60 tablet, Rfl: 0  EXAM:  VITALS per patient if applicable:  GENERAL: alert, oriented, appears well and in no acute distress  HEENT: atraumatic, conjunttiva clear, no obvious abnormalities on inspection of external nose and ears  NECK: normal movements of the head and neck  LUNGS: on inspection no signs of respiratory distress, breathing rate appears normal, no obvious gross SOB, gasping or wheezing  CV: no obvious cyanosis  MS: moves all visible extremities without noticeable abnormality  PSYCH/NEURO: pleasant and cooperative, no obvious depression or anxiety, speech and thought processing grossly intact  ASSESSMENT AND PLAN:  Discussed the following assessment and plan:  Essential hypertension, left leg edema improved with hctz 12.5 mg daily  -cont hctz 12.5 mg qd   Morbid obesity (HCC)-continue adipex x 4 months this will 2nd Rx of 2 month  then will need 3 month break  Leg edema resolved   Vitamin D deficiency D3 50K 1x per week x 6 months then over the counter 5000 IU vitamin D3 daily   Acute midline low back pain without sciatica likely Musculoskeletal  -consider Xray low back in future if continues pt will my chart me back if has this issue again -rec heat as needed tylenol over the counter max dose 3000 mg in 24 hours  -rec back stretches Web MD or [facility] review list of back stretches   Health maintenance  Did not have flu shot  Consider Tdap after COVID in our office or at the pharmacy  Consider hepatitis B vaccine x 2 doses   Eye exam had 2019   Dentist Dr. Chrisandra Carota  PCP Woody Seller saw 03/2017 Shelly Bombard      I discussed the assessment and treatment plan with the patient. The patient was provided an opportunity to ask questions and all were answered. The patient agreed with the plan and demonstrated an understanding of the instructions.   The patient was advised to call back or seek an in-person evaluation if the symptoms worsen or if the condition fails to improve as anticipated.  I provided 15 minutes of non-face-to-face time during this encounter.   Nino Glow McLean-Scocuzza, MD

## 2018-06-07 ENCOUNTER — Telehealth: Payer: Self-pay | Admitting: Internal Medicine

## 2018-06-07 NOTE — Telephone Encounter (Signed)
sch f/u 11/2018 or 12/2018   Thanks [place]

## 2018-06-20 NOTE — Telephone Encounter (Signed)
Pt has been scheduled. Thank you.

## 2018-07-31 ENCOUNTER — Encounter: Payer: Self-pay | Admitting: Internal Medicine

## 2018-11-08 ENCOUNTER — Encounter: Payer: Self-pay | Admitting: Internal Medicine

## 2018-12-01 ENCOUNTER — Other Ambulatory Visit: Payer: Self-pay

## 2018-12-05 ENCOUNTER — Other Ambulatory Visit: Payer: Self-pay

## 2018-12-05 ENCOUNTER — Ambulatory Visit: Payer: BLUE CROSS/BLUE SHIELD | Admitting: Internal Medicine

## 2018-12-05 ENCOUNTER — Encounter: Payer: Self-pay | Admitting: Internal Medicine

## 2018-12-05 VITALS — BP 152/98 | HR 92 | Temp 98.0°F | Ht 73.0 in | Wt >= 6400 oz

## 2018-12-05 DIAGNOSIS — D170 Benign lipomatous neoplasm of skin and subcutaneous tissue of head, face and neck: Secondary | ICD-10-CM

## 2018-12-05 DIAGNOSIS — R7303 Prediabetes: Secondary | ICD-10-CM | POA: Diagnosis not present

## 2018-12-05 DIAGNOSIS — E559 Vitamin D deficiency, unspecified: Secondary | ICD-10-CM | POA: Diagnosis not present

## 2018-12-05 DIAGNOSIS — H81399 Other peripheral vertigo, unspecified ear: Secondary | ICD-10-CM | POA: Diagnosis not present

## 2018-12-05 DIAGNOSIS — N62 Hypertrophy of breast: Secondary | ICD-10-CM

## 2018-12-05 DIAGNOSIS — N644 Mastodynia: Secondary | ICD-10-CM

## 2018-12-05 DIAGNOSIS — Z23 Encounter for immunization: Secondary | ICD-10-CM | POA: Diagnosis not present

## 2018-12-05 DIAGNOSIS — I10 Essential (primary) hypertension: Secondary | ICD-10-CM

## 2018-12-05 DIAGNOSIS — R42 Dizziness and giddiness: Secondary | ICD-10-CM | POA: Diagnosis not present

## 2018-12-05 DIAGNOSIS — E291 Testicular hypofunction: Secondary | ICD-10-CM

## 2018-12-05 LAB — BASIC METABOLIC PANEL
BUN: 14 mg/dL (ref 6–23)
CO2: 31 mEq/L (ref 19–32)
Calcium: 9.4 mg/dL (ref 8.4–10.5)
Chloride: 100 mEq/L (ref 96–112)
Creatinine, Ser: 0.92 mg/dL (ref 0.40–1.50)
GFR: 89.75 mL/min (ref >60.00)
Glucose, Bld: 108 mg/dL — ABNORMAL HIGH (ref 70–99)
Potassium: 4.2 mEq/L (ref 3.5–5.1)
Sodium: 138 mEq/L (ref 135–145)

## 2018-12-05 LAB — TESTOSTERONE: Testosterone: 163.5 ng/dL — ABNORMAL LOW (ref 300.00–890.00)

## 2018-12-05 LAB — HEMOGLOBIN A1C: Hgb A1c MFr Bld: 6.7 % — ABNORMAL HIGH (ref 4.6–6.5)

## 2018-12-05 LAB — VITAMIN D 25 HYDROXY (VIT D DEFICIENCY, FRACTURES): VITD: 32.82 ng/mL (ref 30.00–100.00)

## 2018-12-05 MED ORDER — HYDROCHLOROTHIAZIDE 25 MG PO TABS: 25.0000 mg | ORAL_TABLET | Freq: Every day | ORAL | 3 refills | Status: DC

## 2018-12-05 MED ORDER — DIAZEPAM 5 MG PO TABS: 5.0000 mg | ORAL_TABLET | Freq: Once | ORAL | 0 refills | Status: DC | PRN

## 2018-12-05 MED ORDER — MECLIZINE HCL 25 MG PO TABS: 12.5000 mg | ORAL_TABLET | Freq: Two times a day (BID) | ORAL | 11 refills | Status: DC | PRN

## 2018-12-05 NOTE — Addendum Note (Signed)
Addended by: Elpidio Galea T on: 12/05/2018 09:32 AM   Modules accepted: Orders

## 2018-12-05 NOTE — Patient Instructions (Addendum)
ENT Dr. Tami Ribas or [facility]    Vertigo Vertigo is the feeling that you or your surroundings are moving when they are not. This feeling can come and go at any time. Vertigo often goes away on its own. Vertigo can be dangerous if it occurs while you are doing something that could endanger you or others, such as driving or operating machinery. Your health care provider will do tests to determine the cause of your vertigo. Tests will also help your health care provider decide how best to treat your condition. Follow these instructions at home: Eating and drinking      Drink enough fluid to keep your urine pale yellow.  Do not drink alcohol. Activity  Return to your normal activities as told by your health care provider. Ask your health care provider what activities are safe for you.  In the morning, first sit up on the side of the bed. When you feel okay, stand slowly while you hold onto something until you know that your balance is fine.  Move slowly. Avoid sudden body or head movements or certain positions, as told by your health care provider.  If you have trouble walking or keeping your balance, try using a cane for stability. If you feel dizzy or unstable, sit down right away.  Avoid doing any tasks that would cause danger to you or others if vertigo occurs.  Avoid bending down if you feel dizzy. Place items in your home so that they are easy for you to reach without leaning over.  Do not drive or use heavy machinery if you feel dizzy. General instructions  Take over-the-counter and prescription medicines only as told by your health care provider.  Keep all follow-up visits as told by your health care provider. This is important. Contact a health care provider if:  Your medicines do not relieve your vertigo or they make it worse.  You have a fever.  Your condition gets worse or you develop new symptoms.  Your family or friends notice any behavioral changes.  Your nausea or  vomiting gets worse.  You have numbness or a prickling and tingling sensation in part of your body. Get help right away if you:  Have difficulty moving or speaking.  Are always dizzy.  Faint.  Develop severe headaches.  Have weakness in your hands, arms, or legs.  Have changes in your hearing or vision.  Develop a stiff neck.  Develop sensitivity to light. Summary  Vertigo is the feeling that you or your surroundings are moving when they are not.  Your health care provider will do tests to determine the cause of your vertigo.  Follow instructions for home care. You may be told to avoid certain tasks, positions, or movements.  Contact a health care provider if your medicines do not relieve your symptoms, or if you have a fever, nausea, vomiting, or changes in behavior.  Get help right away if you have severe headaches or difficulty speaking, or you develop hearing or vision problems. This information is not intended to replace advice given to you by your health care provider. Make sure you discuss any questions you have with your health care provider. Document Released: 11/25/2004 Document Revised: 01/09/2018 Document Reviewed: 01/09/2018 Elsevier Patient Education  [employer]. Dizziness Dizziness is a common problem. It is a feeling of unsteadiness or light-headedness. You may feel like you are about to faint. Dizziness can lead to injury if you stumble or fall. Anyone can become dizzy, but dizziness  is more common in older adults. This condition can be caused by a number of things, including medicines, dehydration, or illness. Follow these instructions at home: Eating and drinking  Drink enough fluid to keep your urine clear or pale yellow. This helps to keep you from becoming dehydrated. Try to drink more clear fluids, such as water.  Do not drink alcohol.  Limit your caffeine intake if told to do so by your health care provider. Check ingredients and nutrition  facts to see if a food or beverage contains caffeine.  Limit your salt (sodium) intake if told to do so by your health care provider. Check ingredients and nutrition facts to see if a food or beverage contains sodium. Activity  Avoid making quick movements. ? Rise slowly from chairs and steady yourself until you feel okay. ? In the morning, first sit up on the side of the bed. When you feel okay, stand slowly while you hold onto something until you know that your balance is fine.  If you need to stand in one place for a long time, move your legs often. Tighten and relax the muscles in your legs while you are standing.  Do not drive or use heavy machinery if you feel dizzy.  Avoid bending down if you feel dizzy. Place items in your home so that they are easy for you to reach without leaning over. Lifestyle  Do not use any products that contain nicotine or tobacco, such as cigarettes and e-cigarettes. If you need help quitting, ask your health care provider.  Try to reduce your stress level by using methods such as yoga or meditation. Talk with your health care provider if you need help to manage your stress. General instructions  Watch your dizziness for any changes.  Take over-the-counter and prescription medicines only as told by your health care provider. Talk with your health care provider if you think that your dizziness is caused by a medicine that you are taking.  Tell a friend or a family member that you are feeling dizzy. If he or she notices any changes in your behavior, have this person call your health care provider.  Keep all follow-up visits as told by your health care provider. This is important. Contact a health care provider if:  Your dizziness does not go away.  Your dizziness or light-headedness gets worse.  You feel nauseous.  You have reduced hearing.  You have new symptoms.  You are unsteady on your feet or you feel like the room is spinning. Get help  right away if:  You vomit or have diarrhea and are unable to eat or drink anything.  You have problems talking, walking, swallowing, or using your arms, hands, or legs.  You feel generally weak.  You are not thinking clearly or you have trouble forming sentences. It may take a friend or family member to notice this.  You have chest pain, abdominal pain, shortness of breath, or sweating.  Your vision changes.  You have any bleeding.  You have a severe headache.  You have neck pain or a stiff neck.  You have a fever. These symptoms may represent a serious problem that is an emergency. Do not wait to see if the symptoms will go away. Get medical help right away. Call your local emergency services (911 in the [place] Do not drive yourself to the hospital. Summary  Dizziness is a feeling of unsteadiness or light-headedness. This condition can be caused by a number of things,  including medicines, dehydration, or illness.  Anyone can become dizzy, but dizziness is more common in older adults.  Drink enough fluid to keep your urine clear or pale yellow. Do not drink alcohol.  Avoid making quick movements if you feel dizzy. Monitor your dizziness for any changes. This information is not intended to replace advice given to you by your health care provider. Make sure you discuss any questions you have with your health care provider. Document Released: 08/11/2000 Document Revised: 02/18/2017 Document Reviewed: 03/20/2016 Elsevier Patient Education  2020 [employer].

## 2018-12-05 NOTE — Progress Notes (Addendum)
Chief Complaint  Patient presents with  . Follow-up   1. C/o dizziness and vertigo with nausea x 1 year 1-2x per year room is spinning tried meclizine 12.5 mg last seconds dizziness and starts with ears ringing 1-2 days to 1 week before. No h/a sx's started 3:30 -4 am since age 43 y.o. no hearing loss  2. Right breast pain since last visit but resolved no lump nothing tried  3. HTN BP uncontrolled today 152/96 on hctz 12.5 mg qd  4. Right forehead lipoma wants removed    Review of Systems  Constitutional: Positive for weight loss.  HENT: Negative for hearing loss.   Eyes: Negative for blurred vision.  Respiratory: Negative for shortness of breath.   Cardiovascular: Negative for leg swelling.  Gastrointestinal: Negative for abdominal pain.  Musculoskeletal: Negative for falls.  Skin: Negative for rash.  Neurological: Negative for headaches.  Psychiatric/Behavioral: Negative for depression.   Past Medical History:  Diagnosis Date  . Allergy   . History of chicken pox   . Hypertension    tx'ed in 20s off meds since    Past Surgical History:  Procedure Laterality Date  . left knee arthroscopy     high school    Family History  Problem Relation Age of Onset  . Diabetes Father   . Heart disease Sister        valvular heart disease  . Diabetes Paternal Grandmother   . Hypertension Paternal Grandmother   . Heart disease Paternal Grandmother        valvular heart disease died in late 59s   . Diabetes Paternal Grandfather   . Hypertension Paternal Grandfather    Social History   Socioeconomic History  . Marital status: Married    Spouse name: Not on file  . Number of children: Not on file  . Years of education: Not on file  . Highest education level: Not on file  Occupational History  . Not on file  Social Needs  . Financial resource strain: Not on file  . Food insecurity    Worry: Not on file    Inability: Not on file  . Transportation needs    Medical: Not on  file    Non-medical: Not on file  Tobacco Use  . Smoking status: Never Smoker  . Smokeless tobacco: Never Used  Substance and Sexual Activity  . Alcohol use: Yes  . Drug use: Not Currently  . Sexual activity: Yes  Lifestyle  . Physical activity    Days per week: Not on file    Minutes per session: Not on file  . Stress: Not on file  Relationships  . Social Herbalist on phone: Not on file    Gets together: Not on file    Attends religious service: Not on file    Active member of club or organization: Not on file    Attends meetings of clubs or organizations: Not on file    Relationship status: Not on file  . Intimate partner violence    Fear of current or ex partner: Not on file    Emotionally abused: Not on file    Physically abused: Not on file    Forced sexual activity: Not on file  Other Topics Concern  . Not on file  Social History Narrative   Married to [place]   From [place]   2 kids age 77 and 83 of 04/04/2018    Associates degree professional rad asst. Works  [employer] in [place]    Owns guns, wears seat belt, feels safe in relationship    Current Meds  Medication Sig  . Cholecalciferol 1.25 MG (50000 UT) capsule Take 1 capsule (50,000 Units total) by mouth once a week.  . hydrochlorothiazide (HYDRODIURIL) 25 MG tablet Take 1 tablet (25 mg total) by mouth daily. In am  . [DISCONTINUED] hydrochlorothiazide (HYDRODIURIL) 12.5 MG tablet Take 1 tablet (12.5 mg total) by mouth daily. In am  . [DISCONTINUED] phentermine (ADIPEX-P) 37.5 MG tablet Take 1 tablet (37.5 mg total) by mouth daily before breakfast. Rx 2/2 due to fill 06/03/2018   Allergies  Allergen Reactions  . Nsaids     Hives    No results found for this or any previous visit (from the past 2160 hour(s)). Objective  Body mass index is 52.77 kg/m. Wt Readings from Last 3 Encounters:  12/05/18 (!) 400 lb (181.4 kg)  04/04/18 (!) 408 lb 6.4 oz (185.2 kg)   Temp Readings from Last 3 Encounters:   12/05/18 98 F (36.7 C) (Oral)  04/04/18 98.1 F (36.7 C) (Oral)   BP Readings from Last 3 Encounters:  12/05/18 (!) 152/96  04/04/18 140/90   Pulse Readings from Last 3 Encounters:  12/05/18 92  04/04/18 74    Physical Exam Vitals signs and nursing note reviewed.  Constitutional:      Appearance: Normal appearance. He is well-developed. He is morbidly obese.  HENT:     Head: Normocephalic and atraumatic.     Comments: +mask on   Eyes:     Conjunctiva/sclera: Conjunctivae normal.     Pupils: Pupils are equal, round, and reactive to light.  Cardiovascular:     Rate and Rhythm: Normal rate and regular rhythm.     Heart sounds: Normal heart sounds. No murmur.  Pulmonary:     Effort: Pulmonary effort is normal.     Breath sounds: Normal breath sounds.  Chest:     Breasts: Breasts are symmetrical.        Right: Normal. No swelling, bleeding, inverted nipple, mass, nipple discharge, skin change or tenderness.        Left: Normal. No swelling, bleeding, inverted nipple, mass, nipple discharge, skin change or tenderness.     Comments: B/l gynecomastia no breast mass noted right breast  Lymphadenopathy:     Upper Body:     Right upper body: No axillary adenopathy.     Left upper body: No axillary adenopathy.  Skin:    General: Skin is warm and dry.  Neurological:     General: No focal deficit present.     Mental Status: He is alert and oriented to person, place, and time. Mental status is at baseline.     Gait: Gait normal.  Psychiatric:        Attention and Perception: Attention and perception normal.        Mood and Affect: Mood and affect normal.        Speech: Speech normal.        Behavior: Behavior normal. Behavior is cooperative.        Thought Content: Thought content normal.        Cognition and Memory: Cognition and memory normal.        Judgment: Judgment normal.     Assessment  Plan  Vertigo - Plan: meclizine (ANTIVERT) 25 MG tablet, Ambulatory referral  to ENT Dr. Dorothea Ogle or vaught, MR BRAIN/IAC W WO CONTRAST, diazepam (VALIUM) 5 MG-10 tablet prior to MRI  Control BP  ent seen 12/17/18 Dr. Pryor Ochoa + menieres and rec removal face lesion pending plastics referral  rec hctz for menieres and MRI  Given prednisone  Essential hypertension - Plan: hydrochlorothiazide (HYDRODIURIL) 25 MG tablet increase dose, Basic Metabolic Panel (BMET)  Lipoma of face right forehead - Plan: Ambulatory referral to Plastic Surgery Dr. Lyndee Leo Dillingham   Prediabetes - Plan: Hemoglobin A1c  Vitamin D deficiency - Plan: Vitamin D (25 hydroxy)  Gynecomastia - Plan: Testosterone Breast pain, right  Exam normal today if continues rec mammo and [place] right breast   Health maintenance -physical at f/u  Flu shot and Tdap given today  Consider hepatitis B vaccine x 2 doses   Eye exam had 2019  Dentist Dr. Chrisandra Carota  PCP Woody Seller saw 03/2017 Shelly Bombard  Provider: Dr. Olivia Mackie [facility]Lean[facility]ocuz[facility]-Internal Medicine

## 2018-12-15 DIAGNOSIS — H93293 Other abnormal auditory perceptions, bilateral: Secondary | ICD-10-CM | POA: Diagnosis not present

## 2018-12-15 DIAGNOSIS — H8102 Meniere's disease, left ear: Secondary | ICD-10-CM | POA: Diagnosis not present

## 2018-12-15 DIAGNOSIS — D487 Neoplasm of uncertain behavior of other specified sites: Secondary | ICD-10-CM | POA: Diagnosis not present

## 2018-12-30 ENCOUNTER — Other Ambulatory Visit: Payer: Self-pay

## 2018-12-30 ENCOUNTER — Other Ambulatory Visit: Payer: Self-pay | Admitting: Internal Medicine

## 2018-12-30 ENCOUNTER — Ambulatory Visit
Admission: RE | Admit: 2018-12-30 | Discharge: 2018-12-30 | Disposition: A | Discharge status: Home / Self Care | Payer: BC Managed Care – PPO | Source: Ambulatory Visit | Attending: Internal Medicine | Admitting: Internal Medicine

## 2018-12-30 DIAGNOSIS — R42 Dizziness and giddiness: Secondary | ICD-10-CM

## 2018-12-30 DIAGNOSIS — H81399 Other peripheral vertigo, unspecified ear: Secondary | ICD-10-CM

## 2019-01-27 ENCOUNTER — Ambulatory Visit
Admission: RE | Admit: 2019-01-27 | Discharge: 2019-01-27 | Disposition: A | Discharge status: Home / Self Care | Payer: BC Managed Care – PPO | Source: Ambulatory Visit | Attending: Internal Medicine | Admitting: Internal Medicine

## 2019-01-27 ENCOUNTER — Encounter: Payer: Self-pay | Admitting: Internal Medicine

## 2019-01-27 ENCOUNTER — Other Ambulatory Visit: Payer: Self-pay

## 2019-01-27 DIAGNOSIS — R42 Dizziness and giddiness: Secondary | ICD-10-CM

## 2019-01-27 DIAGNOSIS — H81399 Other peripheral vertigo, unspecified ear: Secondary | ICD-10-CM

## 2019-03-08 ENCOUNTER — Ambulatory Visit (INDEPENDENT_AMBULATORY_CARE_PROVIDER_SITE_OTHER): Payer: BC Managed Care – PPO | Admitting: Internal Medicine

## 2019-03-08 ENCOUNTER — Encounter: Payer: Self-pay | Admitting: Internal Medicine

## 2019-03-08 VITALS — BP 150/88 | Ht 73.0 in | Wt >= 6400 oz

## 2019-03-08 DIAGNOSIS — H814 Vertigo of central origin: Secondary | ICD-10-CM

## 2019-03-08 DIAGNOSIS — Z1329 Encounter for screening for other suspected endocrine disorder: Secondary | ICD-10-CM | POA: Diagnosis not present

## 2019-03-08 DIAGNOSIS — R7989 Other specified abnormal findings of blood chemistry: Secondary | ICD-10-CM | POA: Insufficient documentation

## 2019-03-08 DIAGNOSIS — I10 Essential (primary) hypertension: Secondary | ICD-10-CM

## 2019-03-08 DIAGNOSIS — H8109 Meniere's disease, unspecified ear: Secondary | ICD-10-CM

## 2019-03-08 DIAGNOSIS — E118 Type 2 diabetes mellitus with unspecified complications: Secondary | ICD-10-CM | POA: Insufficient documentation

## 2019-03-08 DIAGNOSIS — E119 Type 2 diabetes mellitus without complications: Secondary | ICD-10-CM | POA: Diagnosis not present

## 2019-03-08 DIAGNOSIS — N62 Hypertrophy of breast: Secondary | ICD-10-CM

## 2019-03-08 MED ORDER — OLMESARTAN MEDOXOMIL-HCTZ 20-12.5 MG PO TABS: 1.0000 | ORAL_TABLET | Freq: Every day | ORAL | 0 refills | Status: DC

## 2019-03-08 NOTE — Progress Notes (Signed)
Virtual Visit via Video Note  I connected with Francisco Hamilton   on 03/08/19 at  8:40 AM EST by a video enabled telemedicine application and verified that I am speaking with the correct person using two identifiers.  Location patient: home Location provider:work or home office Persons participating in the virtual visit: patient, provider, 2 daughters  I discussed the limitations of evaluation and management by telemedicine and the availability of in person appointments. The patient expressed understanding and agreed to proceed.   HPI: 1. Dizziness, chronic with vertigo and nausea 12/17/18 saw Dr. Pryor Ochoa who ? meneires and rec hctz and MRI.  w/u MRI w and w/o contrast was rescheduled x 2 with 2 valium and still could not do this due to anxiety/claustrophobia spoke with radiology Dr. Nevada Crane who rec CT head with and w/o contrast for now and have pt f/u with ENT  2. Face lesion saw plastic surgery but will cost $600-700 to get removed per pt ent could remove it an dhe will disc with ENT 3. Gynecomastia with low T will repeat T no irritability or low energy and right breast pain resolved no lumps per pt in right breast since last visit  4. BP elevated today on hctz 25 mg qd and still trending 139/70 though reduced salt and trying to lose wt disc change in therapy  5. Low back pain resolved     ROS: See pertinent positives and negatives per HPI.  Past Medical History:  Diagnosis Date  . Allergy   . History of chicken pox   . Hypertension    tx'ed in 20s off meds since     Past Surgical History:  Procedure Laterality Date  . left knee arthroscopy     high school     Family History  Problem Relation Age of Onset  . Diabetes Father   . Heart disease Sister        valvular heart disease  . Diabetes Paternal Grandmother   . Hypertension Paternal Grandmother   . Heart disease Paternal Grandmother        valvular heart disease died in late 15s   . Diabetes Paternal Grandfather   .  Hypertension Paternal Grandfather     SOCIAL HX:  Married with 2 daughters    Current Outpatient Medications:  .  Cholecalciferol 25 MCG (1000 UT) capsule, Take 1,000 Units by mouth daily., Disp: , Rfl:  .  meclizine (ANTIVERT) 25 MG tablet, Take 0.5-1 tablets (12.5-25 mg total) by mouth 2 (two) times daily as needed for dizziness., Disp: 60 tablet, Rfl: 11 .  olmesartan-hydrochlorothiazide (BENICAR HCT) 20-12.5 MG tablet, Take 1 tablet by mouth daily. In am. If BP not at goal <130/<80 after 2 weeks increase to 2 pills daily in the am, Disp: 90 tablet, Rfl: 0  EXAM:  VITALS per patient if applicable:  GENERAL: alert, oriented, appears well and in no acute distress  HEENT: atraumatic, conjunttiva clear, no obvious abnormalities on inspection of external nose and ears  NECK: normal movements of the head and neck  LUNGS: on inspection no signs of respiratory distress, breathing rate appears normal, no obvious gross SOB, gasping or wheezing  CV: no obvious cyanosis  MS: moves all visible extremities without noticeable abnormality  PSYCH/NEURO: pleasant and cooperative, no obvious depression or anxiety, speech and thought processing grossly intact  ASSESSMENT AND PLAN:  Discussed the following assessment and plan:  Essential hypertension - Plan: olmesartan-hydrochlorothiazide (BENICAR HCT) 20-12.5 MG tablet stop hctz 25 if BP still  elevated in 2 weeks increase to 2 pills daily benicar hctz, Lipid panel, Comprehensive metabolic panel, CBC with Differential/Platelet labs 04/15/19  Vertigo of central origin - Plan: CT HEAD W & WO CONTRAST instead of MRI with IACS protocol due to claustrophobia Spoke with radiology Dr. Nevada Crane see HPI  Type 2 diabetes mellitus without complication, without long-term current use of insulin ([place]) - Plan: Lipid panel, HgB A1c, Microalbumin / creatinine urine ratio Pt wants to avoid meds for now trying to change diet and exercise  Recheck lipid  Add  ARB  Low testosterone in male with enlarged breasts w/o breast pain Plan: Testosterone Total,Free,Bio, Males-([facility]) If still low consider urology   Meniere's disease, unspecified laterality per Dr. Pryor Ochoa Pt to f/u with him re dizziness and skin lesion on face  Morbid obesity ([place]) -rec healthy diet and exercise   HM Flu utd  tdap utd  Consider hepatitis B vaccine x 2 doses  MMR immune   Eye exam had 2019  Dentist Dr. Chrisandra Carota  PCP Woody Seller saw 03/2017 Shelly Bombard Vitamin D3 rec 2000 IU daily otc  PSA and colonoscopy consider age 14   -we discussed possible serious and likely etiologies, options for evaluation and workup, limitations of telemedicine visit vs in person visit, treatment, treatment risks and precautions. Pt prefers to treat via telemedicine empirically rather then risking or undertaking an in person visit at this moment. Patient agrees to seek prompt in person care if worsening, new symptoms arise, or if is not improving with treatment.   I discussed the assessment and treatment plan with the patient. The patient was provided an opportunity to ask questions and all were answered. The patient agreed with the plan and demonstrated an understanding of the instructions.   The patient was advised to call back or seek an in-person evaluation if the symptoms worsen or if the condition fails to improve as anticipated.  Time spent 30 minutes  Delorise Jackson, MD

## 2019-03-28 ENCOUNTER — Ambulatory Visit
Admission: RE | Admit: 2019-03-28 | Discharge: 2019-03-28 | Disposition: A | Discharge status: Home / Self Care | Payer: BC Managed Care – PPO | Source: Ambulatory Visit | Attending: Internal Medicine | Admitting: Internal Medicine

## 2019-03-28 ENCOUNTER — Other Ambulatory Visit: Payer: Self-pay

## 2019-03-28 DIAGNOSIS — H814 Vertigo of central origin: Secondary | ICD-10-CM

## 2019-03-28 MED ORDER — IOPAMIDOL (ISOVUE-300) INJECTION 61%
75.0000 mL | Freq: Once | INTRAVENOUS | Status: AC | PRN
  Administered 2019-03-28: 75 mL via INTRAVENOUS

## 2019-04-16 ENCOUNTER — Other Ambulatory Visit: Payer: Self-pay

## 2019-04-16 ENCOUNTER — Other Ambulatory Visit (INDEPENDENT_AMBULATORY_CARE_PROVIDER_SITE_OTHER): Payer: BC Managed Care – PPO

## 2019-04-16 DIAGNOSIS — R7989 Other specified abnormal findings of blood chemistry: Secondary | ICD-10-CM

## 2019-04-16 DIAGNOSIS — I10 Essential (primary) hypertension: Secondary | ICD-10-CM

## 2019-04-16 DIAGNOSIS — E119 Type 2 diabetes mellitus without complications: Secondary | ICD-10-CM | POA: Diagnosis not present

## 2019-04-16 DIAGNOSIS — Z1329 Encounter for screening for other suspected endocrine disorder: Secondary | ICD-10-CM | POA: Diagnosis not present

## 2019-04-16 LAB — COMPREHENSIVE METABOLIC PANEL
ALT: 31 U/L (ref 0–53)
AST: 26 U/L (ref 0–37)
Albumin: 4.1 g/dL (ref 3.5–5.2)
Alkaline Phosphatase: 81 U/L (ref 39–117)
BUN: 19 mg/dL (ref 6–23)
CO2: 30 mEq/L (ref 19–32)
Calcium: 9 mg/dL (ref 8.4–10.5)
Chloride: 103 mEq/L (ref 96–112)
Creatinine, Ser: 0.88 mg/dL (ref 0.40–1.50)
GFR: 94.32 mL/min (ref >60.00)
Glucose, Bld: 102 mg/dL — ABNORMAL HIGH (ref 70–99)
Potassium: 4.1 mEq/L (ref 3.5–5.1)
Sodium: 138 mEq/L (ref 135–145)
Total Bilirubin: 0.3 mg/dL (ref 0.2–1.2)
Total Protein: 7.5 g/dL (ref 6.0–8.3)

## 2019-04-16 LAB — CBC WITH DIFFERENTIAL/PLATELET
Basophils Absolute: 0 10*3/uL (ref 0.0–0.1)
Basophils Relative: 0.3 % (ref 0.0–3.0)
Eosinophils Absolute: 0.1 10*3/uL (ref 0.0–0.7)
Eosinophils Relative: 1 % (ref 0.0–5.0)
HCT: 39 % (ref 39.0–52.0)
Hemoglobin: 13.1 g/dL (ref 13.0–17.0)
Lymphocytes Relative: 29.8 % (ref 12.0–46.0)
Lymphs Abs: 1.8 10*3/uL (ref 0.7–4.0)
MCHC: 33.6 g/dL (ref 30.0–36.0)
MCV: 86 fl (ref 78.0–100.0)
Monocytes Absolute: 0.5 10*3/uL (ref 0.1–1.0)
Monocytes Relative: 8.8 % (ref 3.0–12.0)
Neutro Abs: 3.7 10*3/uL (ref 1.4–7.7)
Neutrophils Relative %: 60.1 % (ref 43.0–77.0)
Platelets: 247 10*3/uL (ref 150.0–400.0)
RBC: 4.53 Mil/uL (ref 4.22–5.81)
RDW: 13.6 % (ref 11.5–15.5)
WBC: 6.2 10*3/uL (ref 4.0–10.5)

## 2019-04-16 LAB — LIPID PANEL
Cholesterol: 125 mg/dL (ref 0–200)
HDL: 35 mg/dL — ABNORMAL LOW (ref >39.00)
LDL Cholesterol: 78 mg/dL (ref 0–99)
NonHDL: 90.46
Total CHOL/HDL Ratio: 4
Triglycerides: 63 mg/dL (ref 0.0–149.0)
VLDL: 12.6 mg/dL (ref 0.0–40.0)

## 2019-04-16 LAB — HEMOGLOBIN A1C: Hgb A1c MFr Bld: 6.5 % (ref 4.6–6.5)

## 2019-04-16 LAB — TSH: TSH: 1.02 u[IU]/mL (ref 0.35–4.50)

## 2019-04-17 LAB — TESTOSTERONE TOTAL,FREE,BIO, MALES
Albumin: 4.1 g/dL (ref 3.6–5.1)
Sex Hormone Binding: 33 nmol/L (ref 10–50)
Testosterone: 156 ng/dL — ABNORMAL LOW (ref 250–827)

## 2019-04-17 LAB — MICROALBUMIN / CREATININE URINE RATIO
Creatinine, Urine: 114 mg/dL (ref 20–320)
Microalb Creat Ratio: 2 mcg/mg creat (ref <30)
Microalb, Ur: 0.2 mg/dL

## 2019-04-22 ENCOUNTER — Other Ambulatory Visit: Payer: Self-pay | Admitting: Internal Medicine

## 2019-04-22 DIAGNOSIS — E291 Testicular hypofunction: Secondary | ICD-10-CM

## 2019-04-22 DIAGNOSIS — R7989 Other specified abnormal findings of blood chemistry: Secondary | ICD-10-CM

## 2019-04-27 ENCOUNTER — Encounter: Payer: Self-pay | Admitting: Internal Medicine

## 2019-05-08 ENCOUNTER — Encounter: Payer: Self-pay | Admitting: Internal Medicine

## 2019-05-19 ENCOUNTER — Ambulatory Visit: Payer: BC Managed Care – PPO | Attending: Internal Medicine

## 2019-05-19 DIAGNOSIS — Z23 Encounter for immunization: Secondary | ICD-10-CM

## 2019-05-19 NOTE — Progress Notes (Signed)
   Covid-19 Vaccination Clinic  Name:  Francisco Hamilton    MRN: [id number] DOB: 01-Jul-1975  05/19/2019  Francisco Hamilton was observed post Covid-19 immunization for 15 minutes without incident. He was provided with Vaccine Information Sheet and instruction to access the V-Safe system.   Francisco Hamilton was instructed to call 911 with any severe reactions post vaccine: Marland Kitchen Difficulty breathing  . Swelling of face and throat  . A fast heartbeat  . A bad rash all over body  . Dizziness and weakness   Immunizations Administered    Name Date Dose VIS Date Route   Pfizer COVID-19 Vaccine 05/19/2019  1:17 PM 0.3 mL 02/09/2019 Intramuscular   Manufacturer: [place]   Lot: [id number]   [place]: [id number]

## 2019-05-24 ENCOUNTER — Ambulatory Visit: Payer: BC Managed Care – PPO | Admitting: Urology

## 2019-05-28 ENCOUNTER — Ambulatory Visit: Payer: BC Managed Care – PPO | Admitting: Urology

## 2019-05-31 ENCOUNTER — Ambulatory Visit: Payer: BC Managed Care – PPO | Admitting: Urology

## 2019-06-06 ENCOUNTER — Other Ambulatory Visit: Payer: Self-pay

## 2019-06-08 ENCOUNTER — Ambulatory Visit: Payer: BC Managed Care – PPO | Admitting: Internal Medicine

## 2019-06-08 ENCOUNTER — Encounter: Payer: Self-pay | Admitting: Internal Medicine

## 2019-06-08 ENCOUNTER — Other Ambulatory Visit: Payer: Self-pay

## 2019-06-08 VITALS — BP 126/84 | HR 82 | Temp 97.6°F | Ht 73.0 in | Wt >= 6400 oz

## 2019-06-08 DIAGNOSIS — R7989 Other specified abnormal findings of blood chemistry: Secondary | ICD-10-CM

## 2019-06-08 DIAGNOSIS — I10 Essential (primary) hypertension: Secondary | ICD-10-CM

## 2019-06-08 DIAGNOSIS — Z1389 Encounter for screening for other disorder: Secondary | ICD-10-CM | POA: Diagnosis not present

## 2019-06-08 DIAGNOSIS — E119 Type 2 diabetes mellitus without complications: Secondary | ICD-10-CM

## 2019-06-08 MED ORDER — OLMESARTAN MEDOXOMIL-HCTZ 20-12.5 MG PO TABS: 1.0000 | ORAL_TABLET | Freq: Every day | ORAL | 3 refills | Status: DC

## 2019-06-08 MED ORDER — PHENTERMINE HCL 37.5 MG PO TABS: 37.5000 mg | ORAL_TABLET | Freq: Every day | ORAL | 0 refills | Status: DC

## 2019-06-08 NOTE — Progress Notes (Signed)
Chief Complaint  Patient presents with  . Follow-up   F/u  1. HTN improved on benicar 20-12.5 no meds today trending down  2. Morbid obesity wants to restart adipex healthy diet and exercise  3. Low T pending urology appt  4. MIL died 2019-06-15    Review of Systems  Constitutional: Negative for weight loss.  HENT: Negative for hearing loss.   Eyes: Negative for blurred vision.  Respiratory: Negative for shortness of breath.   Cardiovascular: Negative for chest pain.  Gastrointestinal: Negative for abdominal pain.  Skin: Negative for rash.  Neurological: Negative for dizziness and headaches.  Psychiatric/Behavioral: Negative for depression.   Past Medical History:  Diagnosis Date  . Allergy   . History of chicken pox   . Hypertension    tx'ed in 20s off meds since    Past Surgical History:  Procedure Laterality Date  . left knee arthroscopy     high school    Family History  Problem Relation Age of Onset  . Diabetes Father   . Heart disease Sister        valvular heart disease  . Diabetes Paternal Grandmother   . Hypertension Paternal Grandmother   . Heart disease Paternal Grandmother        valvular heart disease died in late 41s   . Multiple myeloma Paternal Grandmother   . Diabetes Paternal Grandfather   . Hypertension Paternal Grandfather    Social History   Socioeconomic History  . Marital status: Married    Spouse name: Not on file  . Number of children: Not on file  . Years of education: Not on file  . Highest education level: Not on file  Occupational History  . Not on file  Tobacco Use  . Smoking status: Never Smoker  . Smokeless tobacco: Never Used  Substance and Sexual Activity  . Alcohol use: Yes  . Drug use: Not Currently  . Sexual activity: Yes  Other Topics Concern  . 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 Difficulty of Paying Living Expenses:   Food Insecurity:   . Worried About Charity fundraiser in the Last Year:   . Arboriculturist in the Last Year:   Transportation Needs:   . Film/video editor (Medical):   Marland Kitchen Lack of Transportation (Non-Medical):   Physical Activity:   . Days of Exercise per Week:   . Minutes of Exercise per Session:   Stress:   . Feeling of Stress :   Social Connections:   . Frequency of Communication with Friends and Family:   . Frequency of Social Gatherings with Friends and Family:   . Attends Religious Services:   . Active Member of Clubs or Organizations:   . Attends Archivist Meetings:   Marland Kitchen Marital Status:   Intimate Partner Violence:   . Fear of Current or Ex-Partner:   . Emotionally Abused:   Marland Kitchen Physically Abused:   . Sexually Abused:    Current Meds  Medication Sig  . Cholecalciferol 25 MCG (1000 UT) capsule Take 1,000 Units by mouth daily.  Marland Kitchen olmesartan-hydrochlorothiazide (BENICAR HCT) 20-12.5 MG tablet Take  1 tablet by mouth daily. In am. If BP not at goal <130/<80 after 2 weeks increase to 2 pills daily in the am  . [DISCONTINUED] olmesartan-hydrochlorothiazide (BENICAR HCT) 20-12.5 MG tablet Take 1 tablet by mouth daily. In am. If BP not at goal <130/<80 after 2 weeks increase to 2 pills daily in the am   Allergies  Allergen Reactions  . Nsaids     Hives    Recent Results (from the past 2160 hour(s))  Testosterone Total,Free,Bio, Males-([facility])     Status: Abnormal   Collection Time: 04/16/19  9:02 AM  Result Value Ref Range   Testosterone 156 (L) 250 - 827 ng/dL   Albumin 4.1 3.6 - 5.1 g/dL   Sex Hormone Binding 33 10 - 50 nmol/L   Testosterone, Free See below 46.0 - 224.0 pg/mL   Testosterone, Bioavailable  110.0 - 575 ng/dL    Comment: Due to the diminished accuracy of immunoassay at  levels below 250 ng/dL, calculations of the Free and Bioavailable Testosterone are not accurate. If needed, Testosterone, Free, Bio and Total, LC/MS/MS (test code [id number]) is the recommended assay. This specimen  must be collected in a red-top tube with no gel. Marland Kitchen   Microalbumin / creatinine urine ratio     Status: None   Collection Time: 04/16/19  9:02 AM  Result Value Ref Range   Creatinine, Urine 114 20 - 320 mg/dL   Microalb, Ur 0.2 mg/dL    Comment: Reference Range Not established    Microalb Creat Ratio 2 <30 mcg/mg creat    Comment: . The ADA defines abnormalities in albumin excretion as follows: Marland Kitchen Category         Result (mcg/mg creatinine) . Normal                    <30 Microalbuminuria         30-299  Clinical albuminuria   > OR = 300 . The ADA recommends that at least two of three specimens collected within a 3-6 month period be abnormal before considering a patient to be within a diagnostic category.   HgB A1c     Status: None   Collection Time: 04/16/19  9:02 AM  Result Value Ref Range   Hgb A1c MFr Bld 6.5 4.6 - 6.5 %    Comment: Glycemic Control Guidelines for People with Diabetes:Non Diabetic:  <6%Goal of Therapy: <7%Additional Action Suggested:  >8%   TSH     Status: None   Collection Time: 04/16/19  9:02 AM  Result Value Ref Range   TSH 1.02 0.35 - 4.50 uIU/mL  CBC with Differential/Platelet     Status: None   Collection Time: 04/16/19  9:02 AM  Result Value Ref Range   WBC 6.2 4.0 - 10.5 K/uL   RBC 4.53 4.22 - 5.81 Mil/uL   Hemoglobin 13.1 13.0 - 17.0 g/dL   HCT 39.0 39.0 - 52.0 %   MCV 86.0 78.0 - 100.0 fl   MCHC 33.6 30.0 - 36.0 g/dL   RDW 13.6 11.5 - 15.5 %   Platelets 247.0 150.0 - 400.0 K/uL   Neutrophils Relative % 60.1 43.0 - 77.0 %   Lymphocytes Relative 29.8 12.0 - 46.0 %   Monocytes Relative 8.8 3.0 - 12.0 %   Eosinophils Relative 1.0 0.0 - 5.0 %   Basophils Relative 0.3 0.0 - 3.0 %   Neutro Abs 3.7 1.4 - 7.7 K/uL   Lymphs Abs 1.8  0.7 - 4.0 K/uL  Monocytes Absolute 0.5 0.1 - 1.0 K/uL   Eosinophils Absolute 0.1 0.0 - 0.7 K/uL   Basophils Absolute 0.0 0.0 - 0.1 K/uL  Comprehensive metabolic panel     Status: Abnormal   Collection Time: 04/16/19  9:02 AM  Result Value Ref Range   Sodium 138 135 - 145 mEq/L   Potassium 4.1 3.5 - 5.1 mEq/L   Chloride 103 96 - 112 mEq/L   CO2 30 19 - 32 mEq/L   Glucose, Bld 102 (H) 70 - 99 mg/dL   BUN 19 6 - 23 mg/dL   Creatinine, Ser 0.88 0.40 - 1.50 mg/dL   Total Bilirubin 0.3 0.2 - 1.2 mg/dL   Alkaline Phosphatase 81 39 - 117 U/L   AST 26 0 - 37 U/L   ALT 31 0 - 53 U/L   Total Protein 7.5 6.0 - 8.3 g/dL   Albumin 4.1 3.5 - 5.2 g/dL   GFR 94.32 >60.00 mL/min   Calcium 9.0 8.4 - 10.5 mg/dL  Lipid panel     Status: Abnormal   Collection Time: 04/16/19  9:02 AM  Result Value Ref Range   Cholesterol 125 0 - 200 mg/dL    Comment: ATP III Classification       Desirable:  < 200 mg/dL               Borderline High:  200 - 239 mg/dL          High:  > = 240 mg/dL   Triglycerides 63.0 0.0 - 149.0 mg/dL    Comment: Normal:  <150 mg/dLBorderline High:  150 - 199 mg/dL   HDL 35.00 (L) >39.00 mg/dL   VLDL 12.6 0.0 - 40.0 mg/dL   LDL Cholesterol 78 0 - 99 mg/dL   Total CHOL/HDL Ratio 4     Comment:                Men          Women1/2 Average Risk     3.4          3.3Average Risk          5.0          4.42X Average Risk          9.6          7.13X Average Risk          15.0          11.0                       NonHDL 90.46     Comment: NOTE:  Non-HDL goal should be 30 mg/dL higher than patient's LDL goal (i.e. LDL goal of < 70 mg/dL, would have non-HDL goal of < 100 mg/dL)   Objective  Body mass index is 52.96 kg/m. Wt Readings from Last 3 Encounters:  06/08/19 (!) 401 lb 6.4 oz (182.1 kg)  03/08/19 (!) 400 lb (181.4 kg)  12/05/18 (!) 400 lb (181.4 kg)   Temp Readings from Last 3 Encounters:  06/08/19 97.6 F (36.4 C) (Temporal)  12/05/18 98 F (36.7 C) (Oral)  04/04/18 98.1 F  (36.7 C) (Oral)   BP Readings from Last 3 Encounters:  06/08/19 126/84  03/08/19 (!) 150/88  12/05/18 (!) 152/98   Pulse Readings from Last 3 Encounters:  06/08/19 82  12/05/18 92  04/04/18 74    Physical Exam Vitals and nursing note reviewed.  Constitutional:  Appearance: Normal appearance. He is well-developed. He is morbidly obese.  HENT:     Head: Normocephalic and atraumatic.  Eyes:     Conjunctiva/sclera: Conjunctivae normal.     Pupils: Pupils are equal, round, and reactive to light.  Cardiovascular:     Rate and Rhythm: Normal rate and regular rhythm.     Heart sounds: Normal heart sounds. No murmur.  Pulmonary:     Effort: Pulmonary effort is normal.     Breath sounds: Normal breath sounds.  Skin:    General: Skin is warm and dry.  Neurological:     General: No focal deficit present.     Mental Status: He is alert and oriented to person, place, and time. Mental status is at baseline.     Gait: Gait normal.  Psychiatric:        Attention and Perception: Attention and perception normal.        Mood and Affect: Mood and affect normal.        Speech: Speech normal.        Behavior: Behavior normal. Behavior is cooperative.        Thought Content: Thought content normal.        Cognition and Memory: Cognition and memory normal.        Judgment: Judgment normal.     Assessment  Plan  Essential hypertension - Plan: olmesartan-hydrochlorothiazide (BENICAR HCT) 20-12.5 MG tablet  Morbid obesity ([place]) - Plan: phentermine (ADIPEX-P) 37.5 MG tablet, phentermine (ADIPEX-P) 37.5 MG tablet Will my chart when ready referral Dr. Kreg Shro[place] Dr. Zella Richer  Low testosterone in male  Pending urology f/u  HM Flu utd  tdap utd  Consider hepatitis B vaccine x 2 doses MMR immune  1/2 covid shots 2nd 06/13/19   Eye exam had 2019  Dentist Dr. Chrisandra Carota  PCP Woody Seller saw 03/2017 Shelly Bombard Vitamin D3 rec 2000 IU daily otc  PSA and colonoscopy consider age 4    Provider: Dr. Olivia Mackie McLean-Scocuzza-Internal Medicine

## 2019-06-08 NOTE — Patient Instructions (Addendum)
Dr. Zella Richer [place] Dr. Kreg Shro[place] [facility]    Gastric bypass vs sleeve

## 2019-06-12 ENCOUNTER — Telehealth: Payer: Self-pay | Admitting: Internal Medicine

## 2019-06-12 NOTE — Telephone Encounter (Signed)
Prior authorization has been submitted for patient's Phentermine HCI 37.5.  Awaiting approval or denial.

## 2019-06-13 ENCOUNTER — Ambulatory Visit: Payer: BC Managed Care – PPO | Attending: Internal Medicine

## 2019-06-13 DIAGNOSIS — Z23 Encounter for immunization: Secondary | ICD-10-CM

## 2019-06-13 NOTE — Telephone Encounter (Signed)
Exception to Coverage request faxed for phentermine HCI 37.5 on 4.14.2021.

## 2019-06-13 NOTE — Progress Notes (Signed)
   Covid-19 Vaccination Clinic  Name:  Francisco Hamilton    MRN: [id number] DOB: Sep 11, 1975  06/13/2019  Francisco Hamilton was observed post Covid-19 immunization for 15 minutes without incident. He was provided with Vaccine Information Sheet and instruction to access the V-Safe system.   Francisco Hamilton was instructed to call 911 with any severe reactions post vaccine: Marland Kitchen Difficulty breathing  . Swelling of face and throat  . A fast heartbeat  . A bad rash all over body  . Dizziness and weakness   Immunizations Administered    Name Date Dose VIS Date Route   Pfizer COVID-19 Vaccine 06/13/2019  4:02 PM 0.3 mL 02/09/2019 Intramuscular   Manufacturer: [place]   Lot: [id number]   [place]: [id number]

## 2019-06-15 NOTE — Telephone Encounter (Signed)
Prior authorization has been approved.  Left message informing the patient and form sent to scan

## 2019-06-27 ENCOUNTER — Other Ambulatory Visit: Payer: Self-pay

## 2019-06-27 ENCOUNTER — Ambulatory Visit: Payer: BC Managed Care – PPO | Admitting: Urology

## 2019-06-27 VITALS — BP 107/73 | HR 88 | Ht 73.0 in | Wt 392.0 lb

## 2019-06-27 DIAGNOSIS — E291 Testicular hypofunction: Secondary | ICD-10-CM

## 2019-06-27 NOTE — Progress Notes (Signed)
   06/27/19 3:35 PM   Earney Mallet 1975-11-13 [id number]  CC: Low testosterone  HPI: I saw Mr. Launer in urology clinic for evaluation of low testosterone.  He is a 44 year old African-American male with morbid obesity and BMI of 52 and hypertension who was recently found to have 2 low testosterone levels of 156 and 163.  He states these were checked during routine blood work and he was not having any symptoms.  He specifically denies any erectile dysfunction or decreased libido.  He has some decreased energy, but he thinks this is likely related to his obesity.  He denies any cardiac history.  He denies any gross hematuria or flank pain.  He is working with his primary care physician to come up with a plan for weight loss.  He denies any family history of prostate cancer.   PMH: Past Medical History:  Diagnosis Date  . Allergy   . History of chicken pox   . Hypertension    tx'ed in 20s off meds since     Surgical History: Past Surgical History:  Procedure Laterality Date  . left knee arthroscopy     high school     Family History: Family History  Problem Relation Age of Onset  . Diabetes Father   . Heart disease Sister        valvular heart disease  . Diabetes Paternal Grandmother   . Hypertension Paternal Grandmother   . Heart disease Paternal Grandmother        valvular heart disease died in late 80s   . Multiple myeloma Paternal Grandmother   . Diabetes Paternal Grandfather   . Hypertension Paternal Grandfather     Social History:  reports that he has never smoked. He has never used smokeless tobacco. He reports current alcohol use. He reports previous drug use.  Physical Exam: BP 107/73   Pulse 88   Ht '6\' 1"'$  (1.854 m)   Wt (!) 392 lb (177.8 kg)   BMI 51.72 kg/m    Constitutional: Obese, alert and conversational Cardiovascular: No clubbing, cyanosis, or edema. Respiratory: Normal respiratory effort, no increased work of breathing. GI: Abdomen is soft,  nontender, nondistended, no abdominal masses  Laboratory Data: Reviewed, see HPI  Pertinent Imaging: None to review  Assessment & Plan:   In summary, he is a 44 year old male who had 2 values of low testosterone on routine blood work.  He denies any symptoms of low testosterone including decreased libido or erectile dysfunction.  We discussed the controversies at length surrounding testosterone and the AUA guidelines.  I counseled him extensively that weight loss is very important to his overall health goals.  He would like to avoid medications if possible.  I recommended close follow-up in 4 months with a repeat testosterone, estradiol, LH, H/H, and PSA.  We could consider anastrozole to increase his testosterone and decrease estradiol, as I suspect those levels will be high with his obesity.  We reviewed these relationships at length.  RTC 4 months with testosterone, estradiol, LH, H/H, and PSA  Nickolas Madrid, MD 06/27/2019  [facility] [address] [place], [place] [address] [phone]

## 2019-06-27 NOTE — Patient Instructions (Signed)
Testosterone Replacement Therapy  Testosterone replacement therapy (TRT) is used to treat men who have a low testosterone level (hypogonadism). Testosterone is a male hormone that is produced in the testicles. It is responsible for typically male characteristics and for maintaining a man's sex drive and the ability to get an erection. Testosterone also supports bone and muscle health. TRT can be a gel, liquid, or patch that you put on your skin. It can also be in the form of a tablet or an injection. In some cases, your health care provider may insert long-acting pellets under your skin. In most men, the level of testosterone starts to decline gradually after age 58. Low testosterone can also be caused by certain medical conditions, medicines, and obesity. Your health care provider can diagnose hypogonadism with at least two blood tests that are done early in the morning. Low testosterone may not need to be treated. TRT is usually a choice that you make with your health care provider. Your health care provider may recommend TRT if you have low testosterone that is causing symptoms, such as:  Low sex drive.  Erection problems.  Breast enlargement.  Loss of body hair.  Weak muscles or bones.  Shrinking testicles.  Increased body fat.  Low energy.  Hot flashes.  Depression.  Decreased work Systems analyst. TRT is a lifetime treatment. If you stop treatment, your testosterone will drop, and your symptoms may return. What are the risks? Testosterone replacement therapy may have side effects, including:  Lower sperm count.  Skin irritation at the application or injection site.  Mouth irritation if you take an oral tablet.  Acne.  Swelling of your legs or feet.  Tender breasts.  Dizziness.  Sleep disturbance.  Mood swings.  Possible increased risk of stroke or heart attack. Testosterone replacement therapy may also increase your risk for prostate cancer or male breast cancer.  You should not use TRT if you have either of those conditions. Your health care provider also may not recommend TRT if:  You are suspected of having prostate cancer.  You want to father a child.  You have a high number of red blood cells.  You have untreated sleep apnea.  You have a very large prostate. Supplies needed:  Your health care provider will prescribe the testosterone gel, solution, or medicine that you need. If your health care provider teaches you to do self-injections at home, you will also need: ? Your medicine vial. ? Disposable needles and syringes. ? Alcohol swabs. ? A needle disposal container. ? Adhesive bandages. How to use testosterone replacement therapy Your health care provider will help you find the TRT option that will work best for you based on your preference, the side effects, and the cost. You may:  Rub testosterone gel on your upper arm or shoulder every day after a shower. This is the most common type of TRT. Do not let women or children come in contact with the gel.  Apply a testosterone solution under your arms once each day.  Place a testosterone patch on your skin once each day.  Dissolve a testosterone tablet in your mouth twice each day.  Have a testosterone pellet inserted under your skin by your health care provider. This will be replaced every 3-6 months.  Use testosterone nasal spray three times each day.  Get testosterone injections. For some types of testosterone, your health care provider will give you this injection. With other types of testosterone, you may be taught to give injections to  yourself. The frequency of injections may vary based on the type of testosterone that you receive. Follow these instructions at home:  Take over-the-counter and prescription medicines only as told by your health care provider.  Lose weight if you are overweight. Ask your health care provider to help you start a healthy diet and exercise program to  reach and maintain a healthy weight.  Work with your health care provider to treat other medical conditions that may lower your testosterone. These include obesity, high blood pressure, high cholesterol, diabetes, liver disease, kidney disease, and sleep apnea.  Keep all follow-up visits as told by your health care provider. This is important. General recommendations  Discuss all risks and benefits with your health care provider before starting therapy.  Work with your health care provider to check your prostate health and do blood testing before you start therapy.  Do not use any testosterone replacement therapies that are not prescribed by your health care provider or not approved for use in the [place]  Do not use TRT for bodybuilding or to improve sexual performance. TRT should be used only to treat symptoms of low testosterone.  Return for all repeat prostate checks and blood tests during therapy, as told by your health care provider. Where to find more information Learn more about testosterone replacement therapy from:  American Urological Foundation: www.urologyhealth.org/urologic-conditions/low-testosterone-(hypogonadism)  Endocrine Society: www.hormone.org/diseases-and-conditions/mens-health/hypogonadism Contact a health care provider if:  You have side effects from your testosterone replacement therapy.  You continue to have symptoms of low testosterone during treatment.  You develop new symptoms during treatment. Summary  Testosterone replacement therapy is only for men who have low testosterone as determined by blood testing and who have symptoms of low testosterone.  Testosterone replacement therapy should be prescribed only by a health care provider and should be used under the supervision of a health care provider.  You may not be able to take testosterone if you have certain medical conditions, including prostate cancer, male breast cancer, or heart  disease.  Testosterone replacement therapy may have side effects and may make some medical conditions worse.  Talk with your health care provider about all the risks and benefits before you start therapy. This information is not intended to replace advice given to you by your health care provider. Make sure you discuss any questions you have with your health care provider. Document Revised: 02/29/2016 Document Reviewed: 11/06/2015 Elsevier Patient Education  2020 Elsevier Inc.  

## 2019-07-19 ENCOUNTER — Encounter: Payer: Self-pay | Admitting: Internal Medicine

## 2019-07-23 NOTE — Telephone Encounter (Signed)
Contacted Patient's wife and got her scheduled.

## 2019-08-15 ENCOUNTER — Telehealth: Payer: Self-pay | Admitting: Internal Medicine

## 2019-08-15 NOTE — Telephone Encounter (Signed)
faxed

## 2019-08-15 NOTE — Telephone Encounter (Signed)
Faxed weight reduction medications prior authorization for phentermine tab 37.5mg  to [employer] [facility] 08-15-19

## 2019-08-24 ENCOUNTER — Encounter: Payer: Self-pay | Admitting: Internal Medicine

## 2019-10-29 ENCOUNTER — Other Ambulatory Visit: Payer: BC Managed Care – PPO

## 2019-10-31 ENCOUNTER — Ambulatory Visit: Payer: BC Managed Care – PPO | Admitting: Urology

## 2019-11-01 ENCOUNTER — Encounter: Payer: Self-pay | Admitting: Internal Medicine

## 2019-11-07 ENCOUNTER — Ambulatory Visit: Payer: BC Managed Care – PPO | Admitting: Urology

## 2019-11-09 ENCOUNTER — Other Ambulatory Visit: Payer: Self-pay

## 2019-11-09 ENCOUNTER — Other Ambulatory Visit: Payer: BC Managed Care – PPO

## 2019-11-09 DIAGNOSIS — E291 Testicular hypofunction: Secondary | ICD-10-CM | POA: Diagnosis not present

## 2019-11-10 LAB — HEMOGLOBIN AND HEMATOCRIT, BLOOD
Hematocrit: 41.9 % (ref 37.5–51.0)
Hemoglobin: 13.9 g/dL (ref 13.0–17.7)

## 2019-11-10 LAB — PSA: Prostate Specific Ag, Serum: 0.2 ng/mL (ref 0.0–4.0)

## 2019-11-10 LAB — LUTEINIZING HORMONE: LH: 6.4 m[IU]/mL (ref 1.7–8.6)

## 2019-11-10 LAB — ESTRADIOL: Estradiol: 47 pg/mL — ABNORMAL HIGH (ref 7.6–42.6)

## 2019-11-10 LAB — TESTOSTERONE: Testosterone: 303 ng/dL (ref 264–916)

## 2019-11-14 ENCOUNTER — Ambulatory Visit: Payer: BC Managed Care – PPO | Admitting: Urology

## 2019-11-14 ENCOUNTER — Encounter: Payer: Self-pay | Admitting: Internal Medicine

## 2019-11-14 ENCOUNTER — Other Ambulatory Visit: Payer: Self-pay

## 2019-11-14 ENCOUNTER — Ambulatory Visit: Payer: BC Managed Care – PPO | Admitting: Internal Medicine

## 2019-11-14 ENCOUNTER — Other Ambulatory Visit
Admission: RE | Admit: 2019-11-14 | Discharge: 2019-11-14 | Disposition: A | Discharge status: Home / Self Care | Payer: BC Managed Care – PPO | Attending: Internal Medicine | Admitting: Internal Medicine

## 2019-11-14 ENCOUNTER — Encounter: Payer: Self-pay | Admitting: Urology

## 2019-11-14 VITALS — BP 135/81 | HR 98 | Ht 73.0 in | Wt 392.0 lb

## 2019-11-14 VITALS — BP 125/84 | HR 114 | Ht 73.0 in | Wt 390.7 lb

## 2019-11-14 DIAGNOSIS — E1159 Type 2 diabetes mellitus with other circulatory complications: Secondary | ICD-10-CM

## 2019-11-14 DIAGNOSIS — R7303 Prediabetes: Secondary | ICD-10-CM | POA: Diagnosis not present

## 2019-11-14 DIAGNOSIS — Z6841 Body Mass Index (BMI) 40.0 and over, adult: Secondary | ICD-10-CM

## 2019-11-14 DIAGNOSIS — I10 Essential (primary) hypertension: Secondary | ICD-10-CM

## 2019-11-14 DIAGNOSIS — Z1389 Encounter for screening for other disorder: Secondary | ICD-10-CM

## 2019-11-14 DIAGNOSIS — N62 Hypertrophy of breast: Secondary | ICD-10-CM

## 2019-11-14 DIAGNOSIS — M545 Low back pain: Secondary | ICD-10-CM | POA: Diagnosis not present

## 2019-11-14 DIAGNOSIS — M62838 Other muscle spasm: Secondary | ICD-10-CM

## 2019-11-14 DIAGNOSIS — I152 Hypertension secondary to endocrine disorders: Secondary | ICD-10-CM | POA: Insufficient documentation

## 2019-11-14 DIAGNOSIS — N644 Mastodynia: Secondary | ICD-10-CM

## 2019-11-14 DIAGNOSIS — R7989 Other specified abnormal findings of blood chemistry: Secondary | ICD-10-CM

## 2019-11-14 DIAGNOSIS — G8929 Other chronic pain: Secondary | ICD-10-CM

## 2019-11-14 DIAGNOSIS — E669 Obesity, unspecified: Secondary | ICD-10-CM

## 2019-11-14 DIAGNOSIS — M25572 Pain in left ankle and joints of left foot: Secondary | ICD-10-CM

## 2019-11-14 DIAGNOSIS — Z0000 Encounter for general adult medical examination without abnormal findings: Secondary | ICD-10-CM | POA: Insufficient documentation

## 2019-11-14 DIAGNOSIS — E1169 Type 2 diabetes mellitus with other specified complication: Secondary | ICD-10-CM

## 2019-11-14 DIAGNOSIS — E119 Type 2 diabetes mellitus without complications: Secondary | ICD-10-CM | POA: Insufficient documentation

## 2019-11-14 LAB — COMPREHENSIVE METABOLIC PANEL
ALT: 29 U/L (ref 0–44)
AST: 20 U/L (ref 15–41)
Albumin: 4.1 g/dL (ref 3.5–5.0)
Alkaline Phosphatase: 84 U/L (ref 38–126)
Anion gap: 10 (ref 5–15)
BUN: 13 mg/dL (ref 6–20)
CO2: 29 mmol/L (ref 22–32)
Calcium: 9.1 mg/dL (ref 8.9–10.3)
Chloride: 100 mmol/L (ref 98–111)
Creatinine, Ser: 1 mg/dL (ref 0.61–1.24)
GFR calc Af Amer: >60 mL/min (ref >60)
GFR calc non Af Amer: >60 mL/min (ref >60)
Glucose, Bld: 106 mg/dL — ABNORMAL HIGH (ref 70–99)
Potassium: 4 mmol/L (ref 3.5–5.1)
Sodium: 139 mmol/L (ref 135–145)
Total Bilirubin: 0.9 mg/dL (ref 0.3–1.2)
Total Protein: 8 g/dL (ref 6.5–8.1)

## 2019-11-14 LAB — LIPID PANEL
Cholesterol: 152 mg/dL (ref 0–200)
HDL: 34 mg/dL — ABNORMAL LOW (ref >40)
LDL Cholesterol: 103 mg/dL — ABNORMAL HIGH (ref 0–99)
Total CHOL/HDL Ratio: 4.5 RATIO
Triglycerides: 76 mg/dL (ref <150)
VLDL: 15 mg/dL (ref 0–40)

## 2019-11-14 LAB — CBC WITH DIFFERENTIAL/PLATELET
Abs Immature Granulocytes: 0.02 10*3/uL (ref 0.00–0.07)
Basophils Absolute: 0 10*3/uL (ref 0.0–0.1)
Basophils Relative: 1 %
Eosinophils Absolute: 0.1 10*3/uL (ref 0.0–0.5)
Eosinophils Relative: 1 %
HCT: 39.8 % (ref 39.0–52.0)
Hemoglobin: 13.5 g/dL (ref 13.0–17.0)
Immature Granulocytes: 0 %
Lymphocytes Relative: 27 %
Lymphs Abs: 1.7 10*3/uL (ref 0.7–4.0)
MCH: 28.6 pg (ref 26.0–34.0)
MCHC: 33.9 g/dL (ref 30.0–36.0)
MCV: 84.3 fL (ref 80.0–100.0)
Monocytes Absolute: 0.6 10*3/uL (ref 0.1–1.0)
Monocytes Relative: 10 %
Neutro Abs: 3.9 10*3/uL (ref 1.7–7.7)
Neutrophils Relative %: 61 %
Platelets: 234 10*3/uL (ref 150–400)
RBC: 4.72 MIL/uL (ref 4.22–5.81)
RDW: 12.7 % (ref 11.5–15.5)
WBC: 6.4 10*3/uL (ref 4.0–10.5)
nRBC: 0 % (ref 0.0–0.2)

## 2019-11-14 LAB — URINALYSIS, ROUTINE W REFLEX MICROSCOPIC
Bilirubin Urine: NEGATIVE
Glucose, UA: NEGATIVE mg/dL
Hgb urine dipstick: NEGATIVE
Ketones, ur: NEGATIVE mg/dL
Leukocytes,Ua: NEGATIVE
Nitrite: NEGATIVE
Protein, ur: NEGATIVE mg/dL
Specific Gravity, Urine: 1.018 (ref 1.005–1.030)
pH: 6 (ref 5.0–8.0)

## 2019-11-14 LAB — HEMOGLOBIN A1C
Hgb A1c MFr Bld: 6 % — ABNORMAL HIGH (ref 4.8–5.6)
Mean Plasma Glucose: 125.5 mg/dL

## 2019-11-14 MED ORDER — CYCLOBENZAPRINE HCL 5 MG PO TABS: 5.0000 mg | ORAL_TABLET | Freq: Every evening | ORAL | 2 refills | Status: DC | PRN

## 2019-11-14 NOTE — Progress Notes (Addendum)
Virtual Visit via Video Note  I connected with Francisco Hamilton  on 11/14/19 at  8:10 AM EDT by a video enabled telemedicine application and verified that I am speaking with the correct person using two identifiers.  Location patient: car Location provider:work or home office Persons participating in the virtual visit: patient, provider  I discussed the limitations of evaluation and management by telemedicine and the availability of in person appointments. The patient expressed understanding and agreed to proceed.   HPI:   Annual  1. C/o intermittent mild right low back pain and muscle pulling at times worse since going down stairs and playing football with his son no neck pain but with flexion of neck low back hurts or pulls Tries 2 tylenol and massage machine at home  2. HTN not checked BP today will call back with reading and wt 3. Morbid obesity has cut back exercise due to #1 not weight today  4. C/o right breast 12 oclock tingling soreness intermittently 2x since June/July with enlarged breast and no mass can feel  5. Daughter tested + covid and pt message 11/01/19 what to do no sxs developed and daughter back at school   Review of Systems  Constitutional: Negative for weight loss.  HENT: Negative for hearing loss.   Eyes: Negative for blurred vision.  Respiratory: Negative for shortness of breath.   Cardiovascular: Negative for chest pain.  Gastrointestinal: Negative for abdominal pain.  Genitourinary:       +right breast pain   Musculoskeletal: Positive for back pain.  Skin: Negative for rash.  Neurological: Negative for headaches.  Psychiatric/Behavioral: Negative for depression.   Past Medical History:  Diagnosis Date  . Allergy   . History of chicken pox   . Hypertension    tx'ed in 20s off meds since    Past Surgical History:  Procedure Laterality Date  . left knee arthroscopy     high school    Family History  Problem Relation Age of Onset  . Diabetes Father   .  Heart disease Sister        valvular heart disease  . Diabetes Paternal Grandmother   . Hypertension Paternal Grandmother   . Heart disease Paternal Grandmother        valvular heart disease died in late 81s   . Multiple myeloma Paternal Grandmother   . Diabetes Paternal Grandfather   . Hypertension Paternal Grandfather    Social History   Socioeconomic History  . Marital status: Married    Spouse name: Not on file  . Number of children: Not on file  . Years of education: Not on file  . Highest education level: Not on file  Occupational History  . Not on file  Tobacco Use  . Smoking status: Never Smoker  . Smokeless tobacco: Never Used  Substance and Sexual Activity  . Alcohol use: Yes  . Drug use: Not Currently  . Sexual activity: Yes  Other Topics Concern  . 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 Difficulty of Paying Living Expenses: Not on file  Food Insecurity:   . Worried About Charity fundraiser in the Last Year: Not  on file  . Ran Out of Food in the Last Year: Not on file  Transportation Needs:   . Lack of Transportation (Medical): Not on file  . Lack of Transportation (Non-Medical): Not on file  Physical Activity:   . Days of Exercise per Week: Not on file  . Minutes of Exercise per Session: Not on file  Stress:   . Feeling of Stress : Not on file  Social Connections:   . Frequency of Communication with Friends and Family: Not on file  . Frequency of Social Gatherings with Friends and Family: Not on file  . Attends Religious Services: Not on file  . Active Member of Clubs or Organizations: Not on file  . Attends Archivist Meetings: Not on file  . Marital Status: Not on file  Intimate  Partner Violence:   . Fear of Current or Ex-Partner: Not on file  . Emotionally Abused: Not on file  . Physically Abused: Not on file  . Sexually Abused: Not on file   Current Meds  Medication Sig  . Cholecalciferol 25 MCG (1000 UT) capsule Take 1,000 Units by mouth daily.  . meclizine (ANTIVERT) 25 MG tablet Take 0.5-1 tablets (12.5-25 mg total) by mouth 2 (two) times daily as needed for dizziness.  Marland Kitchen olmesartan-hydrochlorothiazide (BENICAR HCT) 20-12.5 MG tablet Take 1 tablet by mouth daily. In am. If BP not at goal <130/<80 after 2 weeks increase to 2 pills daily in the am  . phentermine (ADIPEX-P) 37.5 MG tablet Take 1 tablet (37.5 mg total) by mouth daily before breakfast.  . phentermine (ADIPEX-P) 37.5 MG tablet Take 1 tablet (37.5 mg total) by mouth daily before breakfast. Rx 2/2   Allergies  Allergen Reactions  . Nsaids     Hives    Recent Results (from the past 2160 hour(s))  PSA     Status: None   Collection Time: 11/09/19  9:37 AM  Result Value Ref Range   Prostate Specific Ag, Serum 0.2 0.0 - 4.0 ng/mL    Comment: Roche ECLIA methodology. According to the American Urological Association, Serum PSA should decrease and remain at undetectable levels after radical prostatectomy. The AUA defines biochemical recurrence as an initial PSA value 0.2 ng/mL or greater followed by a subsequent confirmatory PSA value 0.2 ng/mL or greater. Values obtained with different assay methods or kits cannot be used interchangeably. Results cannot be interpreted as absolute evidence of the presence or absence of malignant disease.   Hemoglobin and Hematocrit, Blood     Status: None   Collection Time: 11/09/19  9:37 AM  Result Value Ref Range   Hemoglobin 13.9 13.0 - 17.7 g/dL   Hematocrit 41.9 37.5 - 51.0 %  Estradiol     Status: Abnormal   Collection Time: 11/09/19  9:37 AM  Result Value Ref Range   Estradiol 47.0 (H) 7.6 - 42.6 pg/mL    Comment: Roche ECLIA methodology  Luteinizing  hormone     Status: None   Collection Time: 11/09/19  9:37 AM  Result Value Ref Range   LH 6.4 1.7 - 8.6 mIU/mL  Testosterone     Status: None   Collection Time: 11/09/19  9:37 AM  Result Value Ref Range   Testosterone 303 264 - 916 ng/dL    Comment: Adult male reference interval is based on a population of healthy nonobese males (BMI <30) between 53 and 16 years old. [place], [place] [phone]. PMID: [id number].    Objective  Body mass index  is 51.72 kg/m. Wt Readings from Last 3 Encounters:  11/14/19 (!) 392 lb (177.8 kg)  06/27/19 (!) 392 lb (177.8 kg)  06/08/19 (!) 401 lb 6.4 oz (182.1 kg)   Temp Readings from Last 3 Encounters:  06/08/19 97.6 F (36.4 C) (Temporal)  12/05/18 98 F (36.7 C) (Oral)  04/04/18 98.1 F (36.7 C) (Oral)   BP Readings from Last 3 Encounters:  06/27/19 107/73  06/08/19 126/84  03/08/19 (!) 150/88   Pulse Readings from Last 3 Encounters:  06/27/19 88  06/08/19 82  12/05/18 92       ROS: See pertinent positives and negatives per HPI.  Past Medical History:  Diagnosis Date  . Allergy   . History of chicken pox   . Hypertension    tx'ed in 20s off meds since     Past Surgical History:  Procedure Laterality Date  . left knee arthroscopy     high school     Family History  Problem Relation Age of Onset  . Diabetes Father   . Heart disease Sister        valvular heart disease  . Diabetes Paternal Grandmother   . Hypertension Paternal Grandmother   . Heart disease Paternal Grandmother        valvular heart disease died in late 80s   . Multiple myeloma Paternal Grandmother   . Diabetes Paternal Grandfather   . Hypertension Paternal Grandfather     SOCIAL HX: lives and works at home   Current Outpatient Medications:  .  Cholecalciferol 25 MCG (1000 UT) capsule, Take 1,000 Units by mouth daily., Disp: , Rfl:  .  meclizine (ANTIVERT) 25 MG tablet, Take 0.5-1 tablets (12.5-25 mg total) by mouth 2 (two) times  daily as needed for dizziness., Disp: 60 tablet, Rfl: 11 .  olmesartan-hydrochlorothiazide (BENICAR HCT) 20-12.5 MG tablet, Take 1 tablet by mouth daily. In am. If BP not at goal <130/<80 after 2 weeks increase to 2 pills daily in the am, Disp: 90 tablet, Rfl: 3 .  phentermine (ADIPEX-P) 37.5 MG tablet, Take 1 tablet (37.5 mg total) by mouth daily before breakfast., Disp: 60 tablet, Rfl: 0 .  phentermine (ADIPEX-P) 37.5 MG tablet, Take 1 tablet (37.5 mg total) by mouth daily before breakfast. Rx 2/2, Disp: 60 tablet, Rfl: 0 .  cyclobenzaprine (FLEXERIL) 5 MG tablet, Take 1 tablet (5 mg total) by mouth at bedtime as needed for muscle spasms., Disp: 30 tablet, Rfl: 2 .  ipratropium (ATROVENT) 0.06 % nasal spray, Place 2 sprays into both nostrils 3 (three) times daily., Disp: , Rfl:   EXAM:  VITALS per patient if applicable:  GENERAL: alert, oriented, appears well and in no acute distress  HEENT: atraumatic, conjunttiva clear, no obvious abnormalities on inspection of external nose and ears  NECK: normal movements of the head and neck  LUNGS: on inspection no signs of respiratory distress, breathing rate appears normal, no obvious gross SOB, gasping or wheezing  CV: no obvious cyanosis  MS: moves all visible extremities without noticeable abnormality  PSYCH/NEURO: pleasant and cooperative, no obvious depression or anxiety, speech and thought processing grossly intact  ASSESSMENT AND PLAN:  Discussed the following assessment and plan: Annual physical exam Flu utd  tdap utd Consider hepatitis B vaccine x 2 doses MMR immune 2/2 covid shot pfizer 2nd 06/13/19   Eye exam had 2019  Dentist Dr. Chrisandra Carota  PCP Woody Seller saw 03/2017 Shelly Bombard Vitamin D3 rec 2000 IU daily otc  PSA and colonoscopy  consider age 43 rec healthy diet and exercise   Essential hypertension - Plan: Comprehensive metabolic panel, Lipid panel, CBC with Differential/Platelet, Urinalysis, Routine w reflex  microscopic My chart vitals later today  Prediabetes - Plan: Hemoglobin A1c  Chronic right-sided low back pain without sciatica - Plan: DG Lumbar Spine Complete, cyclobenzaprine (FLEXERIL) 5 MG tablet Muscle spasm - Plan: cyclobenzaprine (FLEXERIL) 5 MG tablet Consider PT/MRI in future  Morbid obesity with BMI of 50.0-59.9, adult (HCC) rec healthy diet and exercise   Gynecomastia with low T Breast pain, right - Plan: MM DIAG BREAST TOMO BILATERAL, US BREAST LTD UNI RIGHT INC AXILLA, US BREAST LTD UNI LEFT INC AXILLA   Left ankle pain. Chronic  Referred TFA EXAM: LEFT ANKLE COMPLETE - 3+ VIEW  COMPARISON:  None.  FINDINGS: There is no evidence of fracture, dislocation, or joint effusion. There is a tiny osteophyte on the tip of the medial malleolus. Soft tissues are unremarkable.  IMPRESSION: No significant abnormality.   Electronically Signed   By: Lorriane Shire M.D.   On: 04/05/2018 09:10  -we discussed possible serious and likely etiologies, options for evaluation and workup, limitations of telemedicine visit vs in person visit, treatment, treatment risks and precautions. Pt prefers to treat via telemedicine empirically rather then risking or undertaking an in person visit at this moment.  Work/School slipped offered: Advised to seek prompt follow up telemedicine visit or in person care if worsening, new symptoms arise, or if is not improving with treatment. Did let her know that I only do telemedicine on Tuesdays and Thursdays for [place] and advised follow up visit with PCP or [facility] if needs follow up or if any further questions arise to avoid any delays.   I discussed the assessment and treatment plan with the patient. The patient was provided an opportunity to ask questions and all were answered. The patient agreed with the plan and demonstrated an understanding of the instructions.   The patient was advised to call back or seek an in-person evaluation if the  symptoms worsen or if the condition fails to improve as anticipated.  Time spent 20 min Delorise Jackson, MD

## 2019-11-14 NOTE — Progress Notes (Signed)
   11/14/2019 4:44 PM   Francisco Hamilton 02-02-1976 [id number]  Reason for visit: Follow up low testosterone  HPI: I saw Francisco Hamilton back in urology clinic for follow-up of low testosterone.  Briefly is a 44 year old male with morbid obesity and BMI 52 who I originally saw in April 2021 after his PCP had checked to testosterone levels that were low at 156 and 163.  He was not having any symptoms at this time, and this was during routine blood work.  He is not having any trouble with erections, or decreased libido.  He is having a tough time losing weight.  He denies any new problems since I saw him last.  Lab work last week was notable for a normal testosterone of 303, mildly elevated estradiol of 47, and normal PSA of 0.2.  We reviewed these results at length.  I was very frank with the patient that I think weight loss should be his primary focus right now, and will likely improve his quality of life and overall health more than any testosterone replacement or manipulation.  We briefly reviewed anastrozole as an option to decrease estradiol, and increased testosterone.  He would like to stay off medications if possible.  I counseled him about healthy eating and exercise extensively, and encouraged him to focus on this.  RTC 1 year symptom check, consider repeat labs at that time pending clinical progress  [place]ley [place], [place] [address], [place], [place] [address] [phone]

## 2019-11-14 NOTE — Patient Instructions (Signed)

## 2019-11-15 ENCOUNTER — Encounter: Payer: Self-pay | Admitting: Internal Medicine

## 2019-12-20 ENCOUNTER — Other Ambulatory Visit: Payer: Self-pay

## 2019-12-20 ENCOUNTER — Ambulatory Visit
Admission: RE | Admit: 2019-12-20 | Discharge: 2019-12-20 | Disposition: A | Discharge status: Home / Self Care | Payer: BC Managed Care – PPO | Source: Ambulatory Visit | Attending: Internal Medicine | Admitting: Internal Medicine

## 2019-12-20 DIAGNOSIS — N644 Mastodynia: Secondary | ICD-10-CM

## 2020-04-03 ENCOUNTER — Encounter: Payer: Self-pay | Admitting: Internal Medicine

## 2020-04-04 NOTE — Addendum Note (Signed)
Addended by: Orland Mustard on: 04/04/2020 08:20 AM   Modules accepted: Orders

## 2020-04-30 ENCOUNTER — Ambulatory Visit (INDEPENDENT_AMBULATORY_CARE_PROVIDER_SITE_OTHER): Payer: BC Managed Care – PPO

## 2020-04-30 ENCOUNTER — Other Ambulatory Visit: Payer: Self-pay | Admitting: Podiatry

## 2020-04-30 ENCOUNTER — Encounter: Payer: Self-pay | Admitting: Podiatry

## 2020-04-30 ENCOUNTER — Ambulatory Visit: Payer: BC Managed Care – PPO | Admitting: Podiatry

## 2020-04-30 ENCOUNTER — Other Ambulatory Visit: Payer: Self-pay

## 2020-04-30 DIAGNOSIS — M76822 Posterior tibial tendinitis, left leg: Secondary | ICD-10-CM

## 2020-04-30 DIAGNOSIS — M19072 Primary osteoarthritis, left ankle and foot: Secondary | ICD-10-CM

## 2020-04-30 DIAGNOSIS — M2142 Flat foot [pes planus] (acquired), left foot: Secondary | ICD-10-CM

## 2020-04-30 DIAGNOSIS — M216X2 Other acquired deformities of left foot: Secondary | ICD-10-CM

## 2020-04-30 DIAGNOSIS — M21862 Other specified acquired deformities of left lower leg: Secondary | ICD-10-CM

## 2020-04-30 MED ORDER — METHYLPREDNISOLONE 4 MG PO TBPK: ORAL_TABLET | ORAL | 0 refills | Status: DC

## 2020-04-30 NOTE — Patient Instructions (Signed)
Posterior Tibial Tendinitis  Posterior tibial tendinitis is irritation of a tendon called the posterior tibial tendon. Your posterior tibial tendon is a cord-like tissue that connects bones of your lower leg and foot to a muscle that: 1. Supports your arch. 2. Helps you raise up on your toes. 3. Helps you turn your foot down and in. This condition causes foot and ankle pain. It can also lead to a flat foot. What are the causes? This condition is most often caused by repeated stress to the tendon (overuse injury). It can also be caused by a sudden injury that stresses the tendon, such as landing on your foot after jumping or falling. What increases the risk? This condition is more likely to develop in: 1. People who play a sport that involves putting a lot of pressure on the feet, such as: 1. Basketball. 2. Tennis. 3. Soccer. 4. Hockey. 2. Runners. 3. Females who are older than 45 years of age and are overweight. 4. People with diabetes. 5. People with decreased foot stability. 6. People with flat feet. What are the signs or symptoms? Symptoms include: 1. Pain in the inner ankle. 2. Pain at the arch of your foot. 3. Pain that gets worse with running, walking, or standing. 4. Swelling on the inside of your ankle and foot. 5. Weakness in your ankle or foot. 6. Inability to stand up on tiptoe. 7. Flattening of the arch of your foot. How is this diagnosed? This condition may be diagnosed based on: 1. Your symptoms. 2. Your medical history. 3. A physical exam. 4. Tests, such as: 1. X-ray. 2. MRI. 3. Ultrasound. How is this treated? This condition may be treated by: 1. Putting ice to the injured area. 2. Taking NSAIDs, such as ibuprofen, to reduce pain and swelling. 3. Wearing a special shoe or shoe insert to support your arch (orthotic). 4. Having physical therapy. 5. Replacing high-impact exercise with low-impact exercise, such as swimming or cycling. If your symptoms do not  improve with these treatments, you may need to wear a splint, removable walking boot, or short leg cast for 6-8 weeks to keep your foot and ankle still (immobilized). Follow these instructions at home: If you have a cast, splint, or boot:  Keep it clean and dry.  Check the skin around it every day. Tell your health care provider about any concerns. If you have a cast:  Do not stick anything inside it to scratch your skin. Doing that increases your risk of infection.  You may put lotion on dry skin around the edges of the cast. Do not put lotion on the skin underneath the cast. If you have a splint or boot:  Wear it as told by your health care provider. Remove it only as told by your health care provider.  Loosen it if your toes tingle, become numb, or turn cold and blue. Bathing 1. Do not take baths, swim, or use a hot tub until your health care provider approves. Ask your health care provider if you may take showers. 2. If your cast, splint, or boot is not waterproof: ? Do not let it get wet. ? Cover it with a waterproof covering while you take a bath or a shower. Managing pain and swelling   1. If directed, put ice on the injured area. ? If you have a removable splint or boot, remove it as told by your health care provider. ? Put ice in a plastic bag. ? Place a towel between your   skin and the bag or between your cast and the bag. ? Leave the ice on for 20 minutes, 2-3 times a day. 2. Move your toes often to reduce stiffness and swelling. 3. Raise (elevate) the injured area above the level of your heart while you are sitting or lying down. Activity  Do not use the injured foot to support your body weight until your health care provider says that you can. Use crutches as told by your health care provider.  Do not do activities that make pain or swelling worse.  Ask your health care provider when it is safe to drive if you have a cast, splint, or boot on your foot.  Return to  your normal activities as told by your health care provider. Ask your health care provider what activities are safe for you.  Do exercises as told by your health care provider. General instructions  Take over-the-counter and prescription medicines only as told by your health care provider.  If you have an orthotic, use it as told by your health care provider.  Keep all follow-up visits as told by your health care provider. This is important. How is this prevented?  Wear footwear that is appropriate to your athletic activity.  Avoid athletic activities that cause pain or swelling in your ankle or foot.  Before being active, do range-of-motion and stretching exercises.  If you develop pain or swelling while training, stop training.  If you have pain or swelling that does not improve after a few days of rest, see your health care provider.  If you start a new athletic activity, start gradually so you can build up your strength and flexibility. Contact a health care provider if:  Your symptoms get worse.  Your symptoms do not improve in 6-8 weeks.  You develop new, unexplained symptoms.  Your splint, boot, or cast gets damaged. Summary  Posterior tibial tendinitis is irritation of a tendon called the posterior tibial tendon.  This condition is most often caused by repeated stress to the tendon (overuse injury).  This condition causes foot pain and ankle pain. It can also lead to a flat foot.  This condition may be treated by not doing high-impact activities, applying ice, having physical therapy, wearing orthotics, and wearing a cast, splint, or boot if needed. This information is not intended to replace advice given to you by your health care provider. Make sure you discuss any questions you have with your health care provider. Document Revised: 06/13/2018 Document Reviewed: 04/20/2018 Elsevier Patient Education  2020 [employer]  Posterior Tibial Tendinitis Rehab Ask  your health care provider which exercises are safe for you. Do exercises exactly as told by your health care provider and adjust them as directed. It is normal to feel mild stretching, pulling, tightness, or discomfort as you do these exercises. Stop right away if you feel sudden pain or your pain gets worse. Do not begin these exercises until told by your health care provider. Stretching and range-of-motion exercises These exercises warm up your muscles and joints and improve the movement and flexibility in your ankle and foot. These exercises may also help to relieve pain. Standing wall calf stretch, knee straight   4. Stand with your hands against a wall. 5. Extend your left / right leg behind you, and bend your front knee slightly. If directed, place a folded washcloth under the arch of your foot for support. 6. Point the toes of your back foot slightly inward. 7. Keeping your heels   on the floor and your back knee straight, shift your weight toward the wall. Do not allow your back to arch. You should feel a gentle stretch in your upper left / right calf. 8. Hold this position for 10 seconds. Repeat 10 times. Complete this exercise 2 times a day. Standing wall calf stretch, knee bent 7. Stand with your hands against a wall. 8. Extend your left / right leg behind you, and bend your front knee slightly. If directed, place a folded washcloth under the arch of your foot for support. 9. Point the toes of your back foot slightly inward. 10. Unlock your back knee so it is bent. Keep your heels on the floor. You should feel a gentle stretch deep in your lower left / right calf. 11. Hold this position for 10 seconds. Repeat 10 times. Complete this exercise 2 times a day. Strengthening exercises These exercises build strength and endurance in your ankle and foot. Endurance is the ability to use your muscles for a long time, even after they get tired. Ankle inversion with band 8. Secure one end of a  rubber exercise band or tubing to a fixed object, such as a table leg or a pole, that will stay still when the band is pulled. 9. Loop the other end of the band around the middle of your left / right foot. 10. Sit on the floor facing the object with your left / right leg extended. The band or tube should be slightly tense when your foot is relaxed. 11. Leading with your big toe, slowly bring your left / right foot and ankle inward, toward your other foot (inversion). 12. Hold this position for 10 seconds. 13. Slowly return your foot to the starting position. Repeat 10 times. Complete this exercise 2 times a day. Towel curls   5. Sit in a chair on a non-carpeted surface, and put your feet on the floor. 6. Place a towel in front of your feet. 7. Keeping your heel on the floor, put your left / right foot on the towel. 8. Pull the towel toward you by grabbing the towel with your toes and curling them under. Keep your heel on the floor while you do this. 9. Let your toes relax. 10. Grab the towel with your toes again. Keep going until the towel is completely underneath your foot. Repeat 10 times. Complete this exercise 2 times a day. Balance exercise This exercise improves or maintains your balance. Balance is important in preventing falls. Single leg stand 6. Without wearing shoes, stand near a railing or in a doorway. You may hold on to the railing or door frame as needed for balance. 7. Stand on your left / right foot. Keep your big toe down on the floor and try to keep your arch lifted. ? If balancing in this position is too easy, try the exercise with your eyes closed or while standing on a pillow. 8. Hold this position for 10 seconds. Repeat 10 times. Complete this exercise 2 times a day. This information is not intended to replace advice given to you by your health care provider. Make sure you discuss any questions you have with your health care provider.  

## 2020-05-04 ENCOUNTER — Encounter: Payer: Self-pay | Admitting: Podiatry

## 2020-05-04 NOTE — Progress Notes (Signed)
  Subjective:  Patient ID: Francisco Hamilton, male    DOB: Sep 19, 1975,  MRN: [id number]  Chief Complaint  Patient presents with  . Ankle Pain    Patient presents today for medial side left ankle pain x 6 months.  He says his pain is when he wears non supportive ankle shoes and when walking.  He describes the pain as dull with pressure    45 y.o. male presents with the above complaint. History confirmed with patient.   Objective:  Physical Exam: warm, good capillary refill, no trophic changes or ulcerative lesions, normal DP and PT pulses and normal sensory exam. Left Foot: Pain palpation along the posterior tibial tendon in the retromalleolar groove.  He is unable to do a single heel rise.  Gastrocnemius equinus is present.  Pes planus foot deformity   Radiographs: X-ray of the left ankle: No evidence of fracture dislocation, he has pes planus on the lateral view Assessment:   1. Posterior tibial tendinitis of left lower extremity   2. Posterior tibial tendon dysfunction (PTTD) of left lower extremity   3. Acquired pes planus, left   4. Gastrocnemius equinus of left lower extremity      Plan:  Patient was evaluated and treated and all questions answered.  Discussed the etiology and treatment options for pes planus and posterior tibial tendinitis including stretching, formal physical therapy, bracing, supportive shoegears such as a running shoe or sneaker, custom and pre fabricated orthoses, and oral medications. We also discussed the role of surgical treatment of this for patients who do not improve after exhausting non-surgical treatment options.    -X-rays reviewed as above -Dispensed Tri-Lock ankle brace.  Patient educated on use  -Rx for methylprednisolone taper -Recommend stretching icing, exercises were given him.   Return in about 6 weeks (around 06/11/2020) for re-check PT tendinitis.

## 2020-06-11 ENCOUNTER — Ambulatory Visit: Payer: BC Managed Care – PPO | Admitting: Podiatry

## 2020-06-25 ENCOUNTER — Encounter: Payer: Self-pay | Admitting: Podiatry

## 2020-06-25 ENCOUNTER — Other Ambulatory Visit: Payer: Self-pay

## 2020-06-25 ENCOUNTER — Ambulatory Visit: Payer: BC Managed Care – PPO | Admitting: Podiatry

## 2020-06-25 DIAGNOSIS — M2142 Flat foot [pes planus] (acquired), left foot: Secondary | ICD-10-CM | POA: Diagnosis not present

## 2020-06-25 DIAGNOSIS — B351 Tinea unguium: Secondary | ICD-10-CM

## 2020-06-25 DIAGNOSIS — M216X2 Other acquired deformities of left foot: Secondary | ICD-10-CM

## 2020-06-25 DIAGNOSIS — M76822 Posterior tibial tendinitis, left leg: Secondary | ICD-10-CM | POA: Diagnosis not present

## 2020-06-25 DIAGNOSIS — M21862 Other specified acquired deformities of left lower leg: Secondary | ICD-10-CM

## 2020-06-25 MED ORDER — CICLOPIROX 8 % EX SOLN: Freq: Every day | CUTANEOUS | 3 refills | Status: DC

## 2020-06-25 MED ORDER — TERBINAFINE HCL 250 MG PO TABS: 250.0000 mg | ORAL_TABLET | Freq: Every day | ORAL | 0 refills | Status: AC

## 2020-06-25 NOTE — Progress Notes (Signed)
  Subjective:  Patient ID: Francisco Hamilton, male    DOB: 1975-04-29,  MRN: [id number]  Chief Complaint  Patient presents with  . Foot Pain    "its a little better.  The brace has helped but it does get painful if I overdo it"    45 y.o. male presents with the above complaint. History confirmed with patient. Overall thinks it is improving about 70%.  He has been doing the rehab exercises also inquires about discoloration in the left hallux nail  Objective:  Physical Exam: warm, good capillary refill, no trophic changes or ulcerative lesions, normal DP and PT pulses and normal sensory exam. Left Foot: Today he has no pain on palpation he has good strength 5 out of 5 without pain.  He is unable to do a single heel rise.  Gastrocnemius equinus is present.  Pes planus foot deformity   Radiographs: X-ray of the left ankle: No evidence of fracture dislocation, he has pes planus on the lateral view Assessment:   1. Posterior tibial tendinitis of left lower extremity   2. Posterior tibial tendon dysfunction (PTTD) of left lower extremity   3. Acquired pes planus, left   4. Gastrocnemius equinus of left lower extremity   5. Onychomycosis      Plan:  Patient was evaluated and treated and all questions answered.  Discussed the etiology and treatment options for pes planus and posterior tibial tendinitis including stretching, formal physical therapy, bracing, supportive shoegears such as a running shoe or sneaker, custom and pre fabricated orthoses, and oral medications. We also discussed the role of surgical treatment of this for patients who do not improve after exhausting non-surgical treatment options.    -Doing quite well.  I recommend he continue his rehabilitation exercises with resistance bands.  Begin to transition out of the Tri-Lock brace over the next few weeks  Also discussed etiology treatment options for onychomycosis.  Recommended oral and topical dual therapy.   Prescription for Lamisil and Penlac sent to pharmacy.  Discussed how to use potential complications and adverse effects   Return in about 6 weeks (around 08/06/2020) for re-check posterior tibial tendinitis.

## 2020-06-30 ENCOUNTER — Other Ambulatory Visit: Payer: Self-pay | Admitting: Internal Medicine

## 2020-06-30 DIAGNOSIS — I10 Essential (primary) hypertension: Secondary | ICD-10-CM

## 2020-07-30 ENCOUNTER — Other Ambulatory Visit: Payer: Self-pay | Admitting: Internal Medicine

## 2020-07-30 DIAGNOSIS — G8929 Other chronic pain: Secondary | ICD-10-CM

## 2020-07-30 DIAGNOSIS — M545 Low back pain, unspecified: Secondary | ICD-10-CM

## 2020-07-30 DIAGNOSIS — M62838 Other muscle spasm: Secondary | ICD-10-CM

## 2020-07-30 MED ORDER — PHENTERMINE HCL 37.5 MG PO TABS: 37.5000 mg | ORAL_TABLET | Freq: Every day | ORAL | 0 refills | Status: DC

## 2020-07-30 MED ORDER — CYCLOBENZAPRINE HCL 5 MG PO TABS: 5.0000 mg | ORAL_TABLET | Freq: Every evening | ORAL | 5 refills | Status: DC | PRN

## 2020-08-03 ENCOUNTER — Other Ambulatory Visit: Payer: Self-pay

## 2020-08-03 ENCOUNTER — Encounter (HOSPITAL_BASED_OUTPATIENT_CLINIC_OR_DEPARTMENT_OTHER): Payer: Self-pay

## 2020-08-03 ENCOUNTER — Emergency Department (HOSPITAL_BASED_OUTPATIENT_CLINIC_OR_DEPARTMENT_OTHER)
Admission: EM | Admit: 2020-08-03 | Discharge: 2020-08-04 | Disposition: A | Discharge status: Home / Self Care | Payer: BC Managed Care – PPO | Attending: Emergency Medicine | Admitting: Emergency Medicine

## 2020-08-03 DIAGNOSIS — S61209A Unspecified open wound of unspecified finger without damage to nail, initial encounter: Secondary | ICD-10-CM

## 2020-08-03 DIAGNOSIS — S61304A Unspecified open wound of right ring finger with damage to nail, initial encounter: Secondary | ICD-10-CM | POA: Insufficient documentation

## 2020-08-03 DIAGNOSIS — I10 Essential (primary) hypertension: Secondary | ICD-10-CM | POA: Insufficient documentation

## 2020-08-03 DIAGNOSIS — E119 Type 2 diabetes mellitus without complications: Secondary | ICD-10-CM | POA: Insufficient documentation

## 2020-08-03 DIAGNOSIS — W274XXA Contact with kitchen utensil, initial encounter: Secondary | ICD-10-CM | POA: Diagnosis not present

## 2020-08-03 DIAGNOSIS — S61312A Laceration without foreign body of right middle finger with damage to nail, initial encounter: Secondary | ICD-10-CM | POA: Insufficient documentation

## 2020-08-03 DIAGNOSIS — Z79899 Other long term (current) drug therapy: Secondary | ICD-10-CM | POA: Insufficient documentation

## 2020-08-03 DIAGNOSIS — S61202A Unspecified open wound of right middle finger without damage to nail, initial encounter: Secondary | ICD-10-CM | POA: Diagnosis not present

## 2020-08-03 DIAGNOSIS — S6991XA Unspecified injury of right wrist, hand and finger(s), initial encounter: Secondary | ICD-10-CM | POA: Diagnosis not present

## 2020-08-03 DIAGNOSIS — Z23 Encounter for immunization: Secondary | ICD-10-CM | POA: Diagnosis not present

## 2020-08-03 MED ORDER — TETANUS-DIPHTH-ACELL PERTUSSIS 5-2.5-18.5 LF-MCG/0.5 IM SUSY
0.5000 mL | PREFILLED_SYRINGE | Freq: Once | INTRAMUSCULAR | Status: AC
  Administered 2020-08-04: 0.5 mL via INTRAMUSCULAR
  Filled 2020-08-03: qty 0.5

## 2020-08-03 NOTE — ED Triage Notes (Addendum)
Pt was using a mandolin cutting his onions when he cut the top of his right middle finger two hours PTA. Dressing intact and stable bleeding.

## 2020-08-04 NOTE — Discharge Instructions (Addendum)
You were seen today for an injury to your fingertip.  Keep dressing in place for 2 days.  Apply antibiotic ointment and make sure to apply a nonadherent dressing with dressing changes.  This will take several weeks to heal.

## 2020-08-04 NOTE — ED Provider Notes (Signed)
[facility] EMERGENCY DEPT Provider Note   CSN: [id number] Arrival date & time: 08/03/20  2313     History Chief Complaint  Patient presents with  . Laceration    Francisco Hamilton is a 45 y.o. male.  HPI     This a 45 year old male with history of obesity, hypertension, type 2 diabetes who presents with right finger injury.  Patient reports that he was using a mandolin to slice some food at dinner when he injured his right third and fourth digits.  He is right-handed.  He was unable to get the bleeding to stop at home.  Denies significant pain but does state that it is sharp at times.  Rates his pain at 6 out of 10.  Has not taken anything for his symptoms.  Last tetanus shot 2016.  Past Medical History:  Diagnosis Date  . Allergy   . History of chicken pox   . Hypertension    tx'ed in 20s off meds since     Patient Active Problem List   Diagnosis Date Noted  . Annual physical exam 11/14/2019  . Gynecomastia 11/14/2019  . Obesity, diabetes, and hypertension syndrome ([place]) 11/14/2019  . Hypertension associated with diabetes ([place]) 11/14/2019  . Type 2 diabetes mellitus without complication, without long-term current use of insulin ([place]) 03/08/2019  . Low testosterone in male 03/08/2019  . Hypogonadism in male 12/05/2018  . Low back pain 05/30/2018  . Vitamin D deficiency 04/14/2018  . Essential hypertension 04/04/2018  . Morbid obesity with BMI of 50.0-59.9, adult ([place]) 04/04/2018  . Varicose veins of both lower extremities 04/04/2018  . Snoring 04/04/2018  . Acute left ankle pain 04/04/2018  . Acute sinusitis 07/14/2007  . Eustachian tube dysfunction 07/14/2007  . Malaise and fatigue 03/08/2007  . Dysuria 07/14/2006  . Urinary tract infectious disease 07/14/2006  . Pain in elbow 04/18/2006  . Carbuncle of skin and/or subcutaneous tissue 03/04/2006  . Elevated blood-pressure reading without diagnosis of hypertension 03/04/2006    Past Surgical  History:  Procedure Laterality Date  . left knee arthroscopy     high school        Family History  Problem Relation Age of Onset  . Diabetes Father   . Heart disease Sister        valvular heart disease  . Diabetes Paternal Grandmother   . Hypertension Paternal Grandmother   . Heart disease Paternal Grandmother        valvular heart disease died in late 69s   . Multiple myeloma Paternal Grandmother   . Diabetes Paternal Grandfather   . Hypertension Paternal Grandfather     Social History   Tobacco Use  . Smoking status: Never Smoker  . Smokeless tobacco: Never Used  Substance Use Topics  . Alcohol use: Yes  . Drug use: Not Currently    Home Medications Prior to Admission medications   Medication Sig Start Date End Date Taking? Authorizing Provider  Cholecalciferol 25 MCG (1000 UT) capsule Take 1,000 Units by mouth daily.    [provider]  ciclopirox (PENLAC) 8 % solution Apply topically at bedtime. Apply over nail and surrounding skin. Apply daily over previous coat. After seven (7) days, may remove with alcohol and continue cycle. 06/25/20   McDonald, Stephan Minister, DPM  cyclobenzaprine (FLEXERIL) 5 MG tablet Take 1 tablet (5 mg total) by mouth at bedtime as needed for muscle spasms. 07/30/20   McLean-Scocuzza, Nino Glow, MD  ipratropium (ATROVENT) 0.06 % nasal spray  Place 2 sprays into both nostrils 3 (three) times daily. 10/29/19   [provider]  meclizine (ANTIVERT) 25 MG tablet Take 0.5-1 tablets (12.5-25 mg total) by mouth 2 (two) times daily as needed for dizziness. 12/05/18   McLean-Scocuzza, Nino Glow, MD  olmesartan-hydrochlorothiazide (BENICAR HCT) 20-12.5 MG tablet TAKE 1 TABLET BY MOUTH DAILY IN THE MORNING. IF BLOOD PRESSURE NOT AT GOAL<130/<80 AFTER 2 WEEKS, INCREASE TO 2 TABLETS DAILY IN THE MORNING 06/30/20   McLean-Scocuzza, Nino Glow, MD  phentermine (ADIPEX-P) 37.5 MG tablet Take 1 tablet (37.5 mg total) by mouth daily before breakfast. 1/2 07/30/20    McLean-Scocuzza, Nino Glow, MD  phentermine (ADIPEX-P) 37.5 MG tablet Take 1 tablet (37.5 mg total) by mouth daily before breakfast. Rx 2/2 07/30/20   McLean-Scocuzza, Nino Glow, MD  terbinafine (LAMISIL) 250 MG tablet Take 1 tablet (250 mg total) by mouth daily. 06/25/20 09/23/20  Criselda Peaches, DPM    Allergies    Nsaids  Review of Systems   Review of Systems  Skin: Positive for wound. Negative for color change.  Neurological: Negative for weakness and numbness.  All other systems reviewed and are negative.   Physical Exam Updated Vital Signs BP (!) 178/93 (BP Location: Right Arm)   Pulse 97   Temp 98 F (36.7 C) (Oral)   Resp 16   Ht 1.854 m ($Remove'6\' 1"'EPBviwU$ )   Wt (!) 191.5 kg   SpO2 98%   BMI 55.70 kg/m   Physical Exam Vitals and nursing note reviewed.  Constitutional:      Appearance: He is well-developed. He is obese. He is not ill-appearing.  HENT:     Head: Normocephalic and atraumatic.     Mouth/Throat:     Mouth: Mucous membranes are moist.  Eyes:     Pupils: Pupils are equal, round, and reactive to light.  Cardiovascular:     Rate and Rhythm: Normal rate and regular rhythm.  Pulmonary:     Effort: Pulmonary effort is normal. No respiratory distress.  Musculoskeletal:     Cervical back: Neck supple.     Comments: Focused examination of the right hand with intact flexion extension of all digits, neurovascularly intact  Lymphadenopathy:     Cervical: No cervical adenopathy.  Skin:    General: Skin is warm and dry.     Comments: Avulsion type laceration of the tip of the right middle digit with slight oozing noted, nailbed intact Superficial Avulsion injury between the PIP and DIP joints of the right fourth digit  Neurological:     Mental Status: He is alert and oriented to person, place, and time.  Psychiatric:        Mood and Affect: Mood normal.     ED Results / Procedures / Treatments   Labs (all labs ordered are listed, but only abnormal results are  displayed) Labs Reviewed - No data to display  EKG None  Radiology No results found.  Procedures Procedures   Medications Ordered in ED Medications  Tdap (BOOSTRIX) injection 0.5 mL (has no administration in time range)    ED Course  I have reviewed the triage vital signs and the nursing notes.  Pertinent labs & imaging results that were available during my care of the patient were reviewed by me and considered in my medical decision making (see chart for details).    MDM Rules/Calculators/A&P  Patient presents with avulsion type injury to the right middle and fourth digit.  He is overall nontoxic and vital signs are reassuring.  He does have a blood pressure that is notable to 178/93 although he is asymptomatic.  This could be pain related.  Patient's tetanus was updated.  Wound was dressed at the bedside.  Wound not amenable to repair.  We discussed dressing changes and healing by secondary intention.  He was provided with a finger splint for protection.  After history, exam, and medical workup I feel the patient has been appropriately medically screened and is safe for discharge home. Pertinent diagnoses were discussed with the patient. Patient was given return precautions.  Final Clinical Impression(s) / ED Diagnoses Final diagnoses:  Finger avulsion, initial encounter    Rx / DC Orders ED Discharge Orders    None       Caralynn Gelber, Barbette Hair, MD 08/04/20 0004

## 2020-08-06 ENCOUNTER — Ambulatory Visit: Payer: BC Managed Care – PPO | Admitting: Internal Medicine

## 2020-08-06 ENCOUNTER — Other Ambulatory Visit: Payer: Self-pay

## 2020-08-06 ENCOUNTER — Encounter: Payer: Self-pay | Admitting: Internal Medicine

## 2020-08-06 ENCOUNTER — Encounter: Payer: Self-pay | Admitting: Podiatry

## 2020-08-06 ENCOUNTER — Ambulatory Visit (INDEPENDENT_AMBULATORY_CARE_PROVIDER_SITE_OTHER): Payer: BC Managed Care – PPO | Admitting: Podiatry

## 2020-08-06 VITALS — BP 138/90 | HR 88 | Temp 97.8°F | Ht 73.0 in | Wt 380.0 lb

## 2020-08-06 DIAGNOSIS — Z1211 Encounter for screening for malignant neoplasm of colon: Secondary | ICD-10-CM

## 2020-08-06 DIAGNOSIS — M2142 Flat foot [pes planus] (acquired), left foot: Secondary | ICD-10-CM | POA: Diagnosis not present

## 2020-08-06 DIAGNOSIS — Z973 Presence of spectacles and contact lenses: Secondary | ICD-10-CM

## 2020-08-06 DIAGNOSIS — M216X2 Other acquired deformities of left foot: Secondary | ICD-10-CM

## 2020-08-06 DIAGNOSIS — I10 Essential (primary) hypertension: Secondary | ICD-10-CM

## 2020-08-06 DIAGNOSIS — T148XXA Other injury of unspecified body region, initial encounter: Secondary | ICD-10-CM | POA: Diagnosis not present

## 2020-08-06 DIAGNOSIS — E559 Vitamin D deficiency, unspecified: Secondary | ICD-10-CM

## 2020-08-06 DIAGNOSIS — M62462 Contracture of muscle, left lower leg: Secondary | ICD-10-CM

## 2020-08-06 DIAGNOSIS — Z1329 Encounter for screening for other suspected endocrine disorder: Secondary | ICD-10-CM

## 2020-08-06 DIAGNOSIS — M76822 Posterior tibial tendinitis, left leg: Secondary | ICD-10-CM

## 2020-08-06 DIAGNOSIS — R7303 Prediabetes: Secondary | ICD-10-CM

## 2020-08-06 DIAGNOSIS — B360 Pityriasis versicolor: Secondary | ICD-10-CM | POA: Insufficient documentation

## 2020-08-06 DIAGNOSIS — M21862 Other specified acquired deformities of left lower leg: Secondary | ICD-10-CM

## 2020-08-06 MED ORDER — KETOCONAZOLE 2 % EX CREA: 1.0000 "application " | TOPICAL_CREAM | Freq: Every day | CUTANEOUS | 22 refills | Status: DC

## 2020-08-06 MED ORDER — MUPIROCIN 2 % EX OINT: 1.0000 "application " | TOPICAL_OINTMENT | Freq: Two times a day (BID) | CUTANEOUS | 0 refills | Status: DC

## 2020-08-06 NOTE — Progress Notes (Addendum)
Chief Complaint  Patient presents with   Follow-up   F/u  1.htn on benicar 20-12.5 on meds today fasting today BP sl elevated   2. 08/03/20 right hand 2nd and 3rd fingers cut slicing onions  3. Obesity may want referral to wt loss clinic  4. Wears glasses may want referral to [facility]     Review of Systems  Constitutional: Negative for weight loss.  HENT: Negative for hearing loss.   Eyes: Negative for blurred vision.  Respiratory: Negative for shortness of breath.   Cardiovascular: Negative for chest pain.  Gastrointestinal: Negative for abdominal pain.  Musculoskeletal: Negative for falls and joint pain.  Skin: Negative for rash.  Neurological: Negative for dizziness and headaches.  Psychiatric/Behavioral: Negative for depression.   Past Medical History:  Diagnosis Date   Allergy    History of chicken pox    Hypertension    tx'ed in 20s off meds since    Past Surgical History:  Procedure Laterality Date   left knee arthroscopy     high school    Family History  Problem Relation Age of Onset   Diabetes Father    Heart disease Sister        valvular heart disease   Diabetes Paternal Grandmother    Hypertension Paternal Grandmother    Heart disease Paternal Grandmother        valvular heart disease died in late 62s    Multiple myeloma Paternal Grandmother    Diabetes Paternal Grandfather    Hypertension Paternal Grandfather    Social History   Socioeconomic History   Marital status: Married    Spouse name: Not on file   Number of children: Not on file   Years of education: Not on file   Highest education level: Not on file  Occupational History   Not on file  Tobacco Use   Smoking status: Never Smoker   Smokeless tobacco: Never Used  Substance and Sexual Activity   Alcohol use: Yes   Drug use: Not Currently   Sexual activity: Yes  Other Topics Concern   Not on file  Social History Narrative   Married to [place]   From [place]   2 kids age 9  and 60 as of 07/2020, 2 sons 46 and 62 08/04/20    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   ciclopirox (PENLAC) 8 % solution Apply topically at bedtime. Apply over nail and surrounding skin. Apply daily over previous coat. After seven (7) days, may remove with alcohol and continue cycle.   cyclobenzaprine (FLEXERIL) 5 MG tablet Take 1 tablet (5 mg total) by mouth at bedtime as needed for muscle spasms.   ipratropium (ATROVENT) 0.06 % nasal spray Place 2 sprays into both nostrils 3 (three) times daily.   ketoconazole (NIZORAL) 2 % cream Apply 1 application topically daily. To back and chest   meclizine (ANTIVERT) 25 MG tablet Take 0.5-1 tablets (12.5-25 mg total) by mouth 2 (two) times daily as needed  for dizziness.   mupirocin ointment (BACTROBAN) 2 % Apply 1 application topically 2 (two) times daily.   olmesartan-hydrochlorothiazide (BENICAR HCT) 20-12.5 MG tablet TAKE 1 TABLET BY MOUTH DAILY IN THE MORNING. IF BLOOD PRESSURE NOT AT GOAL<130/<80 AFTER 2 WEEKS, INCREASE TO 2 TABLETS DAILY IN THE MORNING   phentermine (ADIPEX-P) 37.5 MG tablet Take 1 tablet (37.5 mg total) by mouth daily before breakfast. 1/2   phentermine (ADIPEX-P) 37.5 MG tablet Take 1 tablet (37.5 mg total) by mouth daily before breakfast. Rx 2/2   terbinafine (LAMISIL) 250 MG tablet Take 1 tablet (250 mg total) by mouth daily.   Allergies  Allergen Reactions   Nsaids     Hives    No results found for this or any previous visit (from the past 2160 hour(s)). Objective   Body mass index is 50.13 kg/m. Wt Readings from Last 3 Encounters:  08/06/20 (!) 380 lb (172.4 kg)  08/03/20 (!) 422 lb 2.9 oz (191.5 kg)  11/14/19 (!) 390 lb 11.2 oz (177.2 kg)   Temp Readings from Last 3 Encounters:  08/06/20 97.8 F (36.6 C) (Oral)  08/03/20 98 F (36.7 C) (Oral)  06/08/19 97.6 F (36.4 C) (Temporal)   BP Readings from Last 3 Encounters:  08/06/20 138/90  08/03/20 (!) 178/93  11/14/19 125/84   Pulse Readings from Last 3 Encounters:  08/06/20 88  08/03/20 97  11/14/19 (!) 114    Physical Exam Vitals and nursing note reviewed.  Constitutional:      Appearance: Normal appearance. He is well-developed and well-groomed. He is morbidly obese.  HENT:     Head: Normocephalic and atraumatic.  Eyes:     Conjunctiva/sclera: Conjunctivae normal.     Pupils: Pupils are equal, round, and reactive to light.  Cardiovascular:     Rate and Rhythm: Normal rate and regular rhythm.     Heart sounds: Normal heart sounds. No murmur heard.   Pulmonary:     Effort: Pulmonary effort is normal.     Breath sounds: Normal breath sounds.  Abdominal:     General: Abdomen is flat. Bowel sounds are normal.  Skin:    General: Skin is warm and dry.  Neurological:     General: No focal deficit present.     Mental Status: He is alert and oriented to person, place, and time. Mental status is at baseline.     Gait: Gait normal.  Psychiatric:        Attention and Perception: Attention and perception normal.        Mood and Affect: Mood and affect normal.        Speech: Speech normal.        Behavior: Behavior normal. Behavior is cooperative.        Thought Content: Thought content normal.        Cognition and Memory: Cognition and memory normal.        Judgment: Judgment normal.     Assessment  Plan  Open wound - Plan: mupirocin ointment (BACTROBAN) 2 % Dove antibacterial body was  Clean and covered wound care  Hypertension, unspecified type - Plan: Comprehensive  metabolic panel, Lipid panel, CBC with Differential/Platelet benicar 20-12.5 mg qd not had today so BP elevated   Prediabetes - Plan: Hemoglobin A1c  Wears glasses Let me know referral to [facility]   Posterior tibial tendon dysfunction (PTTD) of left lower extremity F/u TFR Dr. Guy Sandifer   Tinea versicolor back/check - Plan: ketoconazole (NIZORAL) 2 % cream  HM Flu utd  tdap utd  Consider hepatitis B vaccine x 2 doses  MMR immune   2/2 covid shot pfizer 2nd 06/13/19    Eye exam had 2019  Dentist Dr. Carlyle Dolly [facility]   PCP Woody Seller saw 03/2017 Shelly Bombard  Vitamin D3 rec 2000 IU daily otc  PSA and colonoscopy consider age 17  -referred [facility]   rec healthy diet and exercise -considering wt loss clinic  Left me know know when ready [facility] referral   Prediabetes - Plan: Hemoglobin A1c   Morbid obesity with BMI of 50.0-59.9, adult ([place]) rec healthy diet and exercise  See above Wants refill of adipex 37.5 bmi 50/15 or 50.13 wt 380 lbs with ht 6'1" A1C 6.2 08/06/20 with h/o htn on medication benicar 20-12.5 qd   Left ankle pain. Chronic  Referred TFA EXAM: LEFT ANKLE COMPLETE - 3+ VIEW   COMPARISON:  None.   FINDINGS: There is no evidence of fracture, dislocation, or joint effusion. There is a tiny osteophyte on the tip of the medial malleolus. Soft tissues are unremarkable.   IMPRESSION: No significant abnormality.     Electronically Signed   By: Lorriane Shire M.D.   On: 04/05/2018 09:10    Provider: Dr. Olivia Mackie McLean-Scocuzza-Internal Medicine

## 2020-08-06 NOTE — Progress Notes (Signed)
  Subjective:  Patient ID: Francisco Hamilton, male    DOB: January 29, 1976,  MRN: [id number]  Chief Complaint  Patient presents with  . Foot Pain    "Its doing better.  I rolled my ankle about a week and a half ago while mowing the lawn and it bothered my for a few days, but I done the stretching and exercises and rested and it feels better now.  Its about a 3 out of 10 now"    45 y.o. male returns for follow-up with the above complaint. History confirmed with patient.  Doing much better he has minimal pain now.  He did roll his ankle but this is resolved Objective:  Physical Exam: warm, good capillary refill, no trophic changes or ulcerative lesions, normal DP and PT pulses and normal sensory exam. Left Foot: Today he has no pain on palpation he has good strength 5 out of 5 without pain.  Pes planus foot deformity   Radiographs: X-ray of the left ankle: No evidence of fracture dislocation, he has pes planus on the lateral view Assessment:   1. Posterior tibial tendinitis of left lower extremity   2. Acquired pes planus, left   3. Gastrocnemius equinus of left lower extremity      Plan:  Patient was evaluated and treated and all questions answered.  At this point he is doing quite well.  He has minimal to no pain.  He knows how to take care of this and has appropriate exercise equipment and bracing at home.  Return as needed.   Return if symptoms worsen or fail to improve.

## 2020-08-06 NOTE — Patient Instructions (Addendum)
Dove antibacterial soap bactine max antiseptic spray with lidocaine  Think about covid 19 booster   Let me know about [facility] referral  Let me know if wt loss clinic referral is wanted    Tinea Versicolor use nizoral cream   Tinea versicolor is a common fungal infection of the skin. It causes a rash that appears as light or dark patches on the skin. The rash most often occurs on the chest, back, neck, or upper arms. This condition is more common during warm weather. Other than affecting how your skin looks, tinea versicolor usually does not cause other problems. In most cases, the infection goes away in a few weeks with treatment. It may take a few months for the patches on your skin to return to your usual skin color. What are the causes? This condition occurs when a type of fungus that is normally present on the skin starts to overgrow. This fungus is a kind of yeast. The exact cause of the overgrowth is not known. This condition cannot be passed from one person to another (it is not contagious). What increases the risk? This condition is more likely to develop when certain factors are present, such as:  Heat and humidity.  Sweating too much.  Hormone changes.  Oily skin.  A weak disease-fighting system (immunesystem). What are the signs or symptoms? Symptoms of this condition include:  A rash of light or dark patches on your skin. The rash may have: ? Patches of tan or pink spots (on light skin). ? Patches of white or brown spots (on dark skin). ? Patches of skin that do not tan. ? Well-marked edges. ? Scales on the discolored areas.  Mild itching. How is this diagnosed? A health care provider can usually diagnose this condition by looking at your skin. During the exam, he or she may use ultraviolet (UV) light to see how much of your skin has been affected. In some cases, a skin sample may be taken by scraping the rash. This sample will be viewed under a microscope to  check for yeast overgrowth. How is this treated? Treatment for this condition may include:  Dandruff shampoo that is applied to the affected skin during showers or bathing.  Over-the-counter medicated skin cream, lotion, or soaps.  Prescription antifungal medicine in the form of skin cream or pills.  Medicine to help reduce itching. Follow these instructions at home:  Take over-the-counter and prescription medicines only as told by your health care provider.  Apply dandruff shampoo to the affected area if your health care provider told you to do that. You may be instructed to scrub the affected skin for several minutes each day.  Do not scratch the affected area of skin.  Avoid hot and humid conditions.  Do not use tanning booths.  Try to avoid sweating a lot. Contact a health care provider if:  Your symptoms get worse.  You have a fever.  You have redness, swelling, or pain at the site of your rash.  You have fluid or blood coming from your rash.  Your rash feels warm to the touch.  You have pus or a bad smell coming from your rash.  Your rash returns (recurs) after treatment. Summary  Tinea versicolor is a common fungal infection of the skin. It causes a rash that appears as light or dark patches on the skin.  The rash most often occurs on the chest, back, neck, or upper arms.  A health care provider can  usually diagnose this condition by looking at your skin.  Treatment may include applying shampoo to the skin and taking or applying medicines. This information is not intended to replace advice given to you by your health care provider. Make sure you discuss any questions you have with your health care provider. Document Revised: 12/12/2019 Document Reviewed: 12/12/2019 Elsevier Patient Education  2021 [employer].

## 2020-08-07 ENCOUNTER — Telehealth: Payer: Self-pay

## 2020-08-07 LAB — CBC WITH DIFFERENTIAL/PLATELET
Basophils Absolute: 0.1 10*3/uL (ref 0.0–0.1)
Basophils Relative: 0.8 % (ref 0.0–3.0)
Eosinophils Absolute: 0.1 10*3/uL (ref 0.0–0.7)
Eosinophils Relative: 0.8 % (ref 0.0–5.0)
HCT: 39.6 % (ref 39.0–52.0)
Hemoglobin: 13.3 g/dL (ref 13.0–17.0)
Lymphocytes Relative: 23.2 % (ref 12.0–46.0)
Lymphs Abs: 1.8 10*3/uL (ref 0.7–4.0)
MCHC: 33.6 g/dL (ref 30.0–36.0)
MCV: 85.8 fl (ref 78.0–100.0)
Monocytes Absolute: 0.7 10*3/uL (ref 0.1–1.0)
Monocytes Relative: 8.8 % (ref 3.0–12.0)
Neutro Abs: 5 10*3/uL (ref 1.4–7.7)
Neutrophils Relative %: 66.4 % (ref 43.0–77.0)
Platelets: 273 10*3/uL (ref 150.0–400.0)
RBC: 4.61 Mil/uL (ref 4.22–5.81)
RDW: 13.3 % (ref 11.5–15.5)
WBC: 7.6 10*3/uL (ref 4.0–10.5)

## 2020-08-07 LAB — COMPREHENSIVE METABOLIC PANEL
ALT: 24 U/L (ref 0–53)
AST: 16 U/L (ref 0–37)
Albumin: 4.3 g/dL (ref 3.5–5.2)
Alkaline Phosphatase: 87 U/L (ref 39–117)
BUN: 13 mg/dL (ref 6–23)
CO2: 32 mEq/L (ref 19–32)
Calcium: 9.4 mg/dL (ref 8.4–10.5)
Chloride: 100 mEq/L (ref 96–112)
Creatinine, Ser: 0.88 mg/dL (ref 0.40–1.50)
GFR: 104.39 mL/min (ref >60.00)
Glucose, Bld: 82 mg/dL (ref 70–99)
Potassium: 4.1 mEq/L (ref 3.5–5.1)
Sodium: 139 mEq/L (ref 135–145)
Total Bilirubin: 0.6 mg/dL (ref 0.2–1.2)
Total Protein: 7.6 g/dL (ref 6.0–8.3)

## 2020-08-07 LAB — LIPID PANEL
Cholesterol: 143 mg/dL (ref 0–200)
HDL: 33.2 mg/dL — ABNORMAL LOW (ref >39.00)
LDL Cholesterol: 92 mg/dL (ref 0–99)
NonHDL: 109.72
Total CHOL/HDL Ratio: 4
Triglycerides: 87 mg/dL (ref 0.0–149.0)
VLDL: 17.4 mg/dL (ref 0.0–40.0)

## 2020-08-07 LAB — HEMOGLOBIN A1C: Hgb A1c MFr Bld: 6.2 % (ref 4.6–6.5)

## 2020-08-07 LAB — TSH: TSH: 1 u[IU]/mL (ref 0.35–4.50)

## 2020-08-07 LAB — VITAMIN D 25 HYDROXY (VIT D DEFICIENCY, FRACTURES): VITD: 21.55 ng/mL — ABNORMAL LOW (ref 30.00–100.00)

## 2020-08-07 NOTE — Telephone Encounter (Signed)
LMTCB for labs. 

## 2020-08-13 ENCOUNTER — Other Ambulatory Visit: Payer: Self-pay | Admitting: Internal Medicine

## 2020-08-13 MED ORDER — PHENTERMINE HCL 37.5 MG PO TABS: 37.5000 mg | ORAL_TABLET | Freq: Every day | ORAL | 0 refills | Status: DC

## 2020-08-15 ENCOUNTER — Telehealth: Payer: Self-pay | Admitting: Internal Medicine

## 2020-08-15 NOTE — Telephone Encounter (Signed)
Prior authorization has been submitted for patient's Phentermine   Awaiting approval or denial.

## 2020-11-13 ENCOUNTER — Ambulatory Visit: Payer: BC Managed Care – PPO | Admitting: Urology

## 2020-11-14 ENCOUNTER — Encounter: Payer: Self-pay | Admitting: Urology

## 2020-11-21 ENCOUNTER — Other Ambulatory Visit: Payer: Self-pay | Admitting: Podiatry

## 2020-11-21 ENCOUNTER — Ambulatory Visit: Payer: BC Managed Care – PPO | Admitting: Podiatry

## 2020-11-21 ENCOUNTER — Other Ambulatory Visit: Payer: Self-pay

## 2020-11-21 ENCOUNTER — Encounter: Payer: Self-pay | Admitting: Podiatry

## 2020-11-21 ENCOUNTER — Ambulatory Visit (INDEPENDENT_AMBULATORY_CARE_PROVIDER_SITE_OTHER): Payer: BC Managed Care – PPO

## 2020-11-21 DIAGNOSIS — M779 Enthesopathy, unspecified: Secondary | ICD-10-CM

## 2020-11-21 DIAGNOSIS — M7672 Peroneal tendinitis, left leg: Secondary | ICD-10-CM

## 2020-11-21 MED ORDER — BETAMETHASONE SOD PHOS & ACET 6 (3-3) MG/ML IJ SUSP
3.0000 mg | Freq: Once | INTRAMUSCULAR | Status: AC
  Administered 2020-11-21: 3 mg via INTRA_ARTICULAR

## 2020-11-21 MED ORDER — MELOXICAM 15 MG PO TABS: 15.0000 mg | ORAL_TABLET | Freq: Every day | ORAL | 1 refills | Status: DC

## 2020-11-21 MED ORDER — METHYLPREDNISOLONE 4 MG PO TBPK: ORAL_TABLET | ORAL | 0 refills | Status: DC

## 2020-11-21 NOTE — Progress Notes (Signed)
   HPI: 45 y.o. male presenting today for new complaint regarding pain and tenderness to the lateral aspect of the left foot this been going on for the past week or 2.  Patient denies a history of injury.  He says it is very painful in the mornings getting out of bed.  He does have a history of posterior tibial tendinitis to the left lower extremity which is currently asymptomatic.  Patient's symptoms today are aggravated by walking.  He has been wearing the ankle brace with mild improvement.  Past Medical History:  Diagnosis Date   Allergy    History of chicken pox    Hypertension    tx'ed in 20s off meds since      Physical Exam: General: The patient is alert and oriented x3 in no acute distress.  Dermatology: Skin is warm, dry and supple bilateral lower extremities. Negative for open lesions or macerations.  Vascular: Palpable pedal pulses bilaterally. No edema or erythema noted. Capillary refill within normal limits.  Neurological: Epicritic and protective threshold grossly intact bilaterally.   Musculoskeletal Exam: Range of motion within normal limits to all pedal and ankle joints bilateral. Muscle strength 5/5 in all groups bilateral.  Pain on palpation noted to the lateral aspect of the left foot at the insertion of the peroneal tendon onto the fifth metatarsal tubercle Radiographic Exam:  Normal osseous mineralization. Joint spaces preserved. No fracture/dislocation/boney destruction.    Assessment: 1.  Insertional peroneal tendinitis left 2. H/o PTTD left; currently asymptomatic   Plan of Care:  1. Patient evaluated. X-Rays reviewed.  2.  Injection of 0.5 cc Celestone Soluspan injected into the fifth metatarsal tubercle area left 3.  Cam boot dispensed.  Weightbearing as tolerated x3 weeks 4.  Prescription for Medrol Dosepak 5.  Prescription for meloxicam 15 mg daily after completion of the Dosepak 6.  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 Edrick Kins, DPM    [address], [place] [address]                Office [phone]  Fax [phone]

## 2021-02-13 ENCOUNTER — Other Ambulatory Visit: Payer: Self-pay

## 2021-02-13 ENCOUNTER — Ambulatory Visit: Payer: BC Managed Care – PPO | Admitting: Internal Medicine

## 2021-02-13 ENCOUNTER — Encounter: Payer: Self-pay | Admitting: Internal Medicine

## 2021-02-13 ENCOUNTER — Other Ambulatory Visit: Payer: Self-pay | Admitting: Internal Medicine

## 2021-02-13 VITALS — BP 144/90 | HR 92 | Temp 97.6°F | Ht 73.0 in | Wt 367.8 lb

## 2021-02-13 DIAGNOSIS — I10 Essential (primary) hypertension: Secondary | ICD-10-CM | POA: Diagnosis not present

## 2021-02-13 DIAGNOSIS — Z0001 Encounter for general adult medical examination with abnormal findings: Secondary | ICD-10-CM | POA: Diagnosis not present

## 2021-02-13 DIAGNOSIS — E559 Vitamin D deficiency, unspecified: Secondary | ICD-10-CM

## 2021-02-13 DIAGNOSIS — R7303 Prediabetes: Secondary | ICD-10-CM | POA: Diagnosis not present

## 2021-02-13 DIAGNOSIS — G8929 Other chronic pain: Secondary | ICD-10-CM

## 2021-02-13 DIAGNOSIS — Z23 Encounter for immunization: Secondary | ICD-10-CM

## 2021-02-13 DIAGNOSIS — Z0000 Encounter for general adult medical examination without abnormal findings: Secondary | ICD-10-CM

## 2021-02-13 DIAGNOSIS — M545 Low back pain, unspecified: Secondary | ICD-10-CM | POA: Diagnosis not present

## 2021-02-13 DIAGNOSIS — Z6841 Body Mass Index (BMI) 40.0 and over, adult: Secondary | ICD-10-CM

## 2021-02-13 DIAGNOSIS — M25562 Pain in left knee: Secondary | ICD-10-CM | POA: Diagnosis not present

## 2021-02-13 DIAGNOSIS — L309 Dermatitis, unspecified: Secondary | ICD-10-CM

## 2021-02-13 LAB — COMPREHENSIVE METABOLIC PANEL
ALT: 19 U/L (ref 0–53)
AST: 14 U/L (ref 0–37)
Albumin: 4.3 g/dL (ref 3.5–5.2)
Alkaline Phosphatase: 86 U/L (ref 39–117)
BUN: 16 mg/dL (ref 6–23)
CO2: 31 mEq/L (ref 19–32)
Calcium: 9.6 mg/dL (ref 8.4–10.5)
Chloride: 102 mEq/L (ref 96–112)
Creatinine, Ser: 0.9 mg/dL (ref 0.40–1.50)
GFR: 103.31 mL/min (ref >60.00)
Glucose, Bld: 93 mg/dL (ref 70–99)
Potassium: 3.9 mEq/L (ref 3.5–5.1)
Sodium: 140 mEq/L (ref 135–145)
Total Bilirubin: 0.6 mg/dL (ref 0.2–1.2)
Total Protein: 8 g/dL (ref 6.0–8.3)

## 2021-02-13 LAB — CBC WITH DIFFERENTIAL/PLATELET
Basophils Absolute: 0 10*3/uL (ref 0.0–0.1)
Basophils Relative: 0.3 % (ref 0.0–3.0)
Eosinophils Absolute: 0 10*3/uL (ref 0.0–0.7)
Eosinophils Relative: 0.5 % (ref 0.0–5.0)
HCT: 41.1 % (ref 39.0–52.0)
Hemoglobin: 13.8 g/dL (ref 13.0–17.0)
Lymphocytes Relative: 26.7 % (ref 12.0–46.0)
Lymphs Abs: 1.7 10*3/uL (ref 0.7–4.0)
MCHC: 33.5 g/dL (ref 30.0–36.0)
MCV: 85.3 fl (ref 78.0–100.0)
Monocytes Absolute: 0.6 10*3/uL (ref 0.1–1.0)
Monocytes Relative: 9.4 % (ref 3.0–12.0)
Neutro Abs: 4.1 10*3/uL (ref 1.4–7.7)
Neutrophils Relative %: 63.1 % (ref 43.0–77.0)
Platelets: 245 10*3/uL (ref 150.0–400.0)
RBC: 4.82 Mil/uL (ref 4.22–5.81)
RDW: 13.6 % (ref 11.5–15.5)
WBC: 6.5 10*3/uL (ref 4.0–10.5)

## 2021-02-13 LAB — LIPID PANEL
Cholesterol: 144 mg/dL (ref 0–200)
HDL: 37.3 mg/dL — ABNORMAL LOW
LDL Cholesterol: 93 mg/dL (ref 0–99)
NonHDL: 106.79
Total CHOL/HDL Ratio: 4
Triglycerides: 70 mg/dL (ref 0.0–149.0)
VLDL: 14 mg/dL (ref 0.0–40.0)

## 2021-02-13 LAB — HEMOGLOBIN A1C: Hgb A1c MFr Bld: 6.2 % (ref 4.6–6.5)

## 2021-02-13 LAB — VITAMIN D 25 HYDROXY (VIT D DEFICIENCY, FRACTURES): VITD: 20.34 ng/mL — ABNORMAL LOW (ref 30.00–100.00)

## 2021-02-13 MED ORDER — CHOLECALCIFEROL 1.25 MG (50000 UT) PO CAPS: 50000.0000 [IU] | ORAL_CAPSULE | ORAL | 1 refills | Status: DC

## 2021-02-13 MED ORDER — TRIAMCINOLONE ACETONIDE 0.1 % EX CREA: 1.0000 "application " | TOPICAL_CREAM | Freq: Two times a day (BID) | CUTANEOUS | 0 refills | Status: DC

## 2021-02-13 NOTE — Progress Notes (Signed)
Chief Complaint  Patient presents with   Annual Exam   Annual  1. Obesity morbid wants referral Dr. Danae Chen wallace has one already scheduled  2. Htn elevated today no meds on benicar 20-12.5 takes 1 daily can take up to 2  3. Left knee h/o meniscus issues and low back pain 4/10 and 3/10 tried Tylenol still painful and limits him working out    Review of Systems  Constitutional:  Negative for weight loss.  HENT:  Negative for hearing loss.   Eyes:  Negative for blurred vision.  Respiratory:  Negative for shortness of breath.   Cardiovascular:  Negative for chest pain.  Gastrointestinal:  Negative for abdominal pain and blood in stool.  Musculoskeletal:  Positive for back pain and joint pain.  Skin:  Negative for rash.  Neurological:  Negative for headaches.  Psychiatric/Behavioral:  Negative for depression.   Past Medical History:  Diagnosis Date   Allergy    History of chicken pox    Hypertension    tx'ed in 20s off meds since    Past Surgical History:  Procedure Laterality Date   left knee arthroscopy     high school menscus issue   Family History  Problem Relation Age of Onset   Diabetes Father    Heart disease Sister        valvular heart disease   Diabetes Paternal Grandmother    Hypertension Paternal Grandmother    Heart disease Paternal Grandmother        valvular heart disease died in late 102s    Multiple myeloma Paternal Grandmother    Diabetes Paternal Grandfather    Hypertension Paternal Grandfather    Social History   Socioeconomic History   Marital status: Married    Spouse name: Not on file   Number of children: Not on file   Years of education: Not on file   Highest education level: Not on file  Occupational History   Not on file  Tobacco Use   Smoking status: Never   Smokeless tobacco: Never  Substance and Sexual Activity   Alcohol use: Yes   Drug use: Not Currently   Sexual activity: Yes  Other Topics Concern   Not on file  Social  History Narrative   Married to [place]   From [place]   2 kids age 47 and 47 as of 07/2020, 2 sons 61 and 19 08/04/20    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   Cholecalciferol 25 MCG (1000 UT) capsule Take 1,000 Units by mouth daily.   ciclopirox (PENLAC) 8 % solution Apply topically at bedtime. Apply over nail and surrounding skin. Apply daily over previous coat. After seven (7) days, may remove with alcohol and continue cycle.   cyclobenzaprine (FLEXERIL) 5 MG tablet Take 1 tablet (5 mg total) by mouth at bedtime as needed for muscle spasms.   meloxicam (MOBIC) 15 MG tablet Take 1 tablet (15 mg total) by mouth daily.   olmesartan-hydrochlorothiazide (BENICAR  HCT) 20-12.5 MG tablet TAKE 1 TABLET BY MOUTH DAILY IN THE MORNING. IF BLOOD PRESSURE NOT AT GOAL<130/<80 AFTER 2 WEEKS, INCREASE TO 2 TABLETS DAILY IN THE MORNING   triamcinolone cream (KENALOG) 0.1 % Apply 1 application topically 2 (two) times daily. Back right side   Allergies  Allergen Reactions   Nsaids     Hives    No results found for this or any previous visit (from the past 2160 hour(s)). Objective  Body mass index is 48.53 kg/m. Wt Readings from Last 3 Encounters:  02/13/21 (!) 367 lb 12.8 oz (166.8 kg)  08/06/20 (!) 380 lb (172.4 kg)  08/03/20 (!) 422 lb 2.9 oz (191.5 kg)   Temp Readings from Last 3 Encounters:  02/13/21 97.6 F (36.4 C) (Temporal)  08/06/20 97.8 F (36.6 C) (Oral)  08/03/20 98 F (36.7 C) (Oral)   BP Readings from Last 3 Encounters:   02/13/21 (!) 144/90  08/06/20 138/90  08/03/20 (!) 178/93   Pulse Readings from Last 3 Encounters:  02/13/21 92  08/06/20 88  08/03/20 97    Physical Exam Vitals and nursing note reviewed.  Constitutional:      Appearance: Normal appearance. He is well-developed and well-groomed. He is obese.  HENT:     Head: Normocephalic and atraumatic.  Eyes:     Conjunctiva/sclera: Conjunctivae normal.     Pupils: Pupils are equal, round, and reactive to light.  Cardiovascular:     Rate and Rhythm: Normal rate and regular rhythm.     Heart sounds: Normal heart sounds.  Pulmonary:     Effort: Pulmonary effort is normal. No respiratory distress.     Breath sounds: Normal breath sounds.  Abdominal:     Tenderness: There is no abdominal tenderness.  Musculoskeletal:     Lumbar back: Tenderness present. Positive right straight leg raise test. Negative left straight leg raise test.     Right knee: Crepitus present. No tenderness.     Left knee: Crepitus present. Tenderness present.     Comments: Prior swelling left knee  Skin:    General: Skin is warm and moist.  Neurological:     General: No focal deficit present.     Mental Status: He is alert and oriented to person, place, and time. Mental status is at baseline.     Sensory: Sensation is intact.     Motor: Motor function is intact.     Coordination: Coordination is intact.     Gait: Gait is intact. Gait normal.  Psychiatric:        Attention and Perception: Attention and perception normal.        Mood and Affect: Mood and affect normal.        Speech: Speech normal.        Behavior: Behavior normal. Behavior is cooperative.        Thought Content: Thought content normal.        Cognition and Memory: Cognition and memory normal.        Judgment: Judgment normal.    Assessment  Plan  Annual physical exam Needs flu shot - Plan: Flu Vaccine QUAD 6+ mos PF IM (Fluarix Quad PF)  Chronic right-sided low back pain without sciatica -  Plan: DG Lumbar Spine Complete  Chronic pain of left knee - Plan: DG Knee Complete 4 Views Left Consider MRI open back and left knee with valium 10 in the future   Vitamin D deficiency - Plan: Vitamin D (25 hydroxy)  Hypertension,  sl elevated no meds today- Plan: Lipid panel, Comprehensive metabolic panel, CBC with Differential/Platelet Benicar 20-12.5 mg 1-2 pills qd   Prediabetes - Plan: Hemoglobin A1c  Morbid obesity ([place]) Morbid obesity with BMI of 45.0-49.9, adult ([place]) - Plan: Amb Ref to [facility] Dr. Juleen China   Eczema, unspecified type - Plan: triamcinolone cream (KENALOG) 0.1 %   HM Flu utd today tdap utd  Consider hepatitis B vaccine x 2 doses  MMR immune   2/2 covid shot pfizer 2nd 06/13/19  Consider prevnar   Eye exam had 2019  Dentist Dr. Carlyle Dolly eye    PCP Woody Seller saw 03/2017 Shelly Bombard  Vitamin D3 rec 2000 IU daily otc  PSA 11/09/19 0.2 and colonoscopy consider age 47  -referred [facility] 07/2020 call for appt   rec healthy diet and exercise -considering wt loss clinic  Left me know know when ready [facility] referral   Provider: Dr. Olivia Mackie McLean-Scocuzza-Internal Medicine

## 2021-02-13 NOTE — Patient Instructions (Addendum)
Dr. Havery Moros   Phone Fax E-mail Address  [phone] Not available [place]   [place] [address]     Specialties     Gastroenterology      Semaglutide Injection What is this medication? SEMAGLUTIDE (SEM a GLOO tide) treats type 2 diabetes. It works by increasing insulin levels in your body, which decreases your blood sugar (glucose). It also reduces the amount of sugar released into the blood and slows down your digestion. It can also be used to lower the risk of heart attack and stroke in people with type 2 diabetes. Changes to diet and exercise are often combined with this medication. This medicine may be used for other purposes; ask your health care provider or pharmacist if you have questions. COMMON BRAND NAME(S): OZEMPIC What should I tell my care team before I take this medication? They need to know if you have any of these conditions: Endocrine tumors (MEN 2) or if someone in your family had these tumors Eye disease, vision problems History of pancreatitis Kidney disease Stomach problems Thyroid cancer or if someone in your family had thyroid cancer An unusual or allergic reaction to semaglutide, other medications, foods, dyes, or preservatives Pregnant or trying to get pregnant Breast-feeding How should I use this medication? This medication is for injection under the skin of your upper leg (thigh), stomach area, or upper arm. It is given once every week (every 7 days). You will be taught how to prepare and give this medication. Use exactly as directed. Take your medication at regular intervals. Do not take it more often than directed. If you use this medication with insulin, you should inject this medication and the insulin separately. Do not mix them together. Do not give the injections right next to each other. Change (rotate) injection sites with each injection. It is important that you put your used needles and syringes in a special sharps  container. Do not put them in a trash can. If you do not have a sharps container, call your pharmacist or care team to get one. A special MedGuide will be given to you by the pharmacist with each prescription and refill. Be sure to read this information carefully each time. This medication comes with INSTRUCTIONS FOR USE. Ask your pharmacist for directions on how to use this medication. Read the information carefully. Talk to your pharmacist or care team if you have questions. Talk to your care team about the use of this medication in children. Special care may be needed. Overdosage: If you think you have taken too much of this medicine contact a poison control center or emergency room at once. NOTE: This medicine is only for you. Do not share this medicine with others. What if I miss a dose? If you miss a dose, take it as soon as you can within 5 days after the missed dose. Then take your next dose at your regular weekly time. If it has been longer than 5 days after the missed dose, do not take the missed dose. Take the next dose at your regular time. Do not take double or extra doses. If you have questions about a missed dose, contact your care team for advice. What may interact with this medication? Other medications for diabetes Many medications may cause changes in blood sugar, these include: Alcohol containing beverages Antiviral medications for HIV or AIDS Aspirin and aspirin-like medications Certain medications for blood pressure, heart disease, irregular heart beat Chromium Diuretics  Male hormones, such as estrogens or progestins, birth control pills Fenofibrate Gemfibrozil Isoniazid Lanreotide Male hormones or anabolic steroids MAOIs like Carbex, Eldepryl, Marplan, Nardil, and Parnate Medications for weight loss Medications for allergies, asthma, cold, or cough Medications for depression, anxiety, or psychotic disturbances Niacin Nicotine NSAIDs, medications for pain and  inflammation, like ibuprofen or naproxen Octreotide Pasireotide Pentamidine Phenytoin Probenecid Quinolone antibiotics such as ciprofloxacin, levofloxacin, ofloxacin Some herbal dietary supplements Steroid medications such as prednisone or cortisone Sulfamethoxazole; trimethoprim Thyroid hormones Some medications can hide the warning symptoms of low blood sugar (hypoglycemia). You may need to monitor your blood sugar more closely if you are taking one of these medications. These include: Beta-blockers, often used for high blood pressure or heart problems (examples include atenolol, metoprolol, propranolol) Clonidine Guanethidine Reserpine This list may not describe all possible interactions. Give your health care provider a list of all the medicines, herbs, non-prescription drugs, or dietary supplements you use. Also tell them if you smoke, drink alcohol, or use illegal drugs. Some items may interact with your medicine. What should I watch for while using this medication? Visit your care team for regular checks on your progress. Drink plenty of fluids while taking this medication. Check with your care team if you get an attack of severe diarrhea, nausea, and vomiting. The loss of too much body fluid can make it dangerous for you to take this medication. A test called the HbA1C (A1C) will be monitored. This is a simple blood test. It measures your blood sugar control over the last 2 to 3 months. You will receive this test every 3 to 6 months. Learn how to check your blood sugar. Learn the symptoms of low and high blood sugar and how to manage them. Always carry a quick-source of sugar with you in case you have symptoms of low blood sugar. Examples include hard sugar candy or glucose tablets. Make sure others know that you can choke if you eat or drink when you develop serious symptoms of low blood sugar, such as seizures or unconsciousness. They must get medical help at once. Tell your care  team if you have high blood sugar. You might need to change the dose of your medication. If you are sick or exercising more than usual, you might need to change the dose of your medication. Do not skip meals. Ask your care team if you should avoid alcohol. Many nonprescription cough and cold products contain sugar or alcohol. These can affect blood sugar. Pens should never be shared. Even if the needle is changed, sharing may result in passing of viruses like hepatitis or HIV. Wear a medical ID bracelet or chain, and carry a card that describes your disease and details of your medication and dosage times. Do not become pregnant while taking this medication. Women should inform their care team if they wish to become pregnant or think they might be pregnant. There is a potential for serious side effects to an unborn child. Talk to your care team for more information. What side effects may I notice from receiving this medication? Side effects that you should report to your care team as soon as possible: Allergic reactions--skin rash, itching, hives, swelling of the face, lips, tongue, or throat Change in vision Dehydration--increased thirst, dry mouth, feeling faint or lightheaded, headache, dark yellow or brown urine Gallbladder problems--severe stomach pain, nausea, vomiting, fever Heart palpitations--rapid, pounding, or irregular heartbeat Kidney injury--decrease in the amount of urine, swelling of the ankles, hands, or feet Pancreatitis--severe  stomach pain that spreads to your back or gets worse after eating or when touched, fever, nausea, vomiting Thyroid cancer--new mass or lump in the neck, pain or trouble swallowing, trouble breathing, hoarseness Side effects that usually do not require medical attention (report to your care team if they continue or are bothersome): Diarrhea Loss of appetite Nausea Stomach pain Vomiting This list may not describe all possible side effects. Call your doctor  for medical advice about side effects. You may report side effects to FDA at 1-800-FDA-1088. Where should I keep my medication? Keep out of the reach of children. Store unopened pens in a refrigerator between 2 and 8 degrees C (36 and 46 degrees F). Do not freeze. Protect from light and heat. After you first use the pen, it can be stored for 56 days at room temperature between 15 and 30 degrees C (59 and 86 degrees F) or in a refrigerator. Throw away your used pen after 56 days or after the expiration date, whichever comes first. Do not store your pen with the needle attached. If the needle is left on, medication may leak from the pen. NOTE: This sheet is a summary. It may not cover all possible information. If you have questions about this medicine, talk to your doctor, pharmacist, or health care provider.  2022 Elsevier/Gold Standard (2020-05-22 00:00:00)

## 2021-02-17 ENCOUNTER — Encounter: Payer: Self-pay | Admitting: Internal Medicine

## 2021-02-17 IMAGING — US US BREAST*R* LIMITED INC AXILLA
1 series · 4 of 4 positions shown · non-contrast
Comparison: None.

ACR Breast Density Category a: The breast tissue is almost entirely
fatty.

CLINICAL DATA: 44-year-old male presenting for evaluation of pain
deep within the right breast since about Brandin or Go.

EXAM:
DIGITAL DIAGNOSTIC BILATERAL MAMMOGRAM WITH CAD AND TOMO
ULTRASOUND BILATERAL BREAST

[Series 1: us breast*right* limited inc axilla · 0.07mm/px · 4 of 4 slices shown]
[im 1/4]
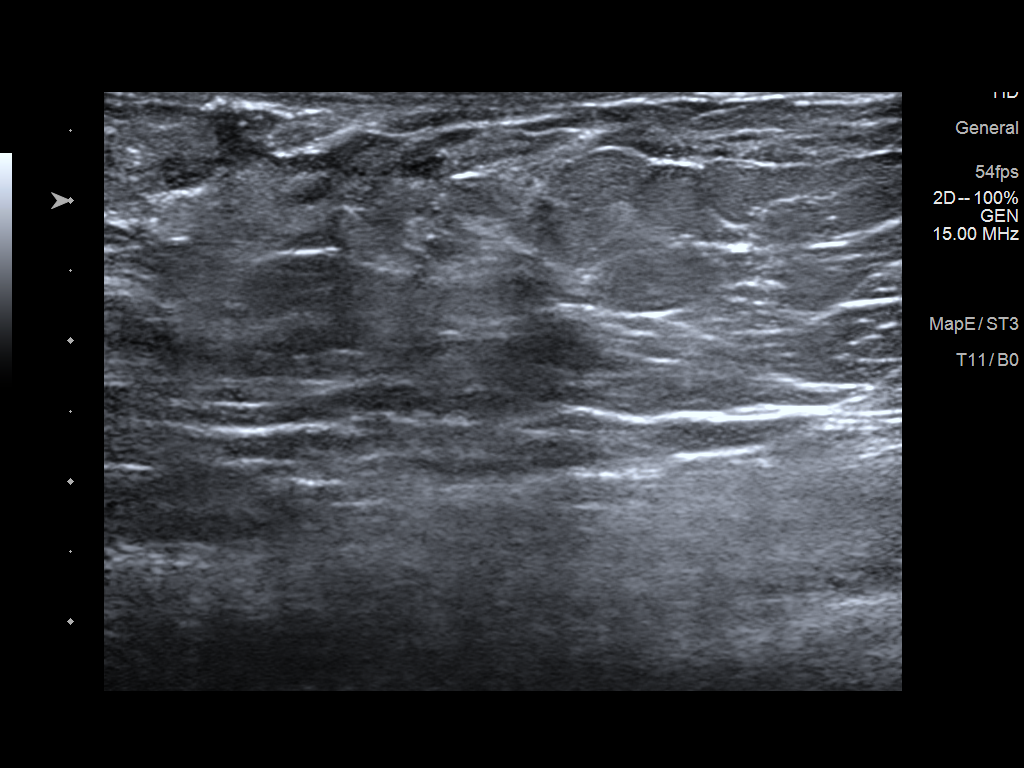
[im 2/4]
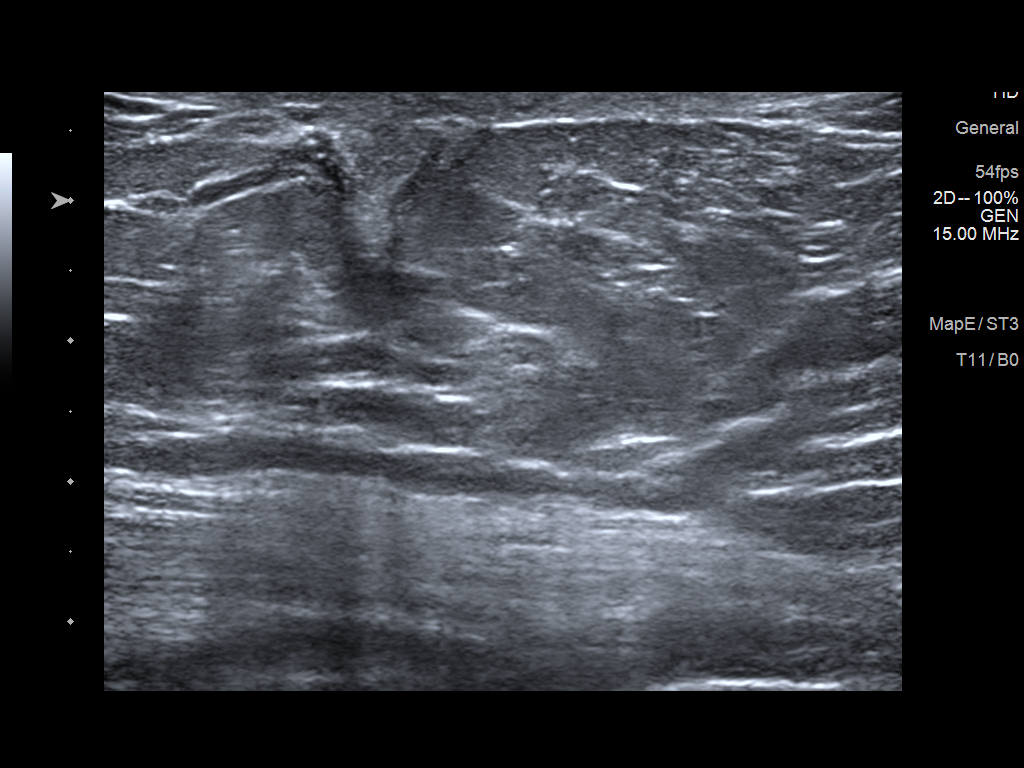
[im 3/4]
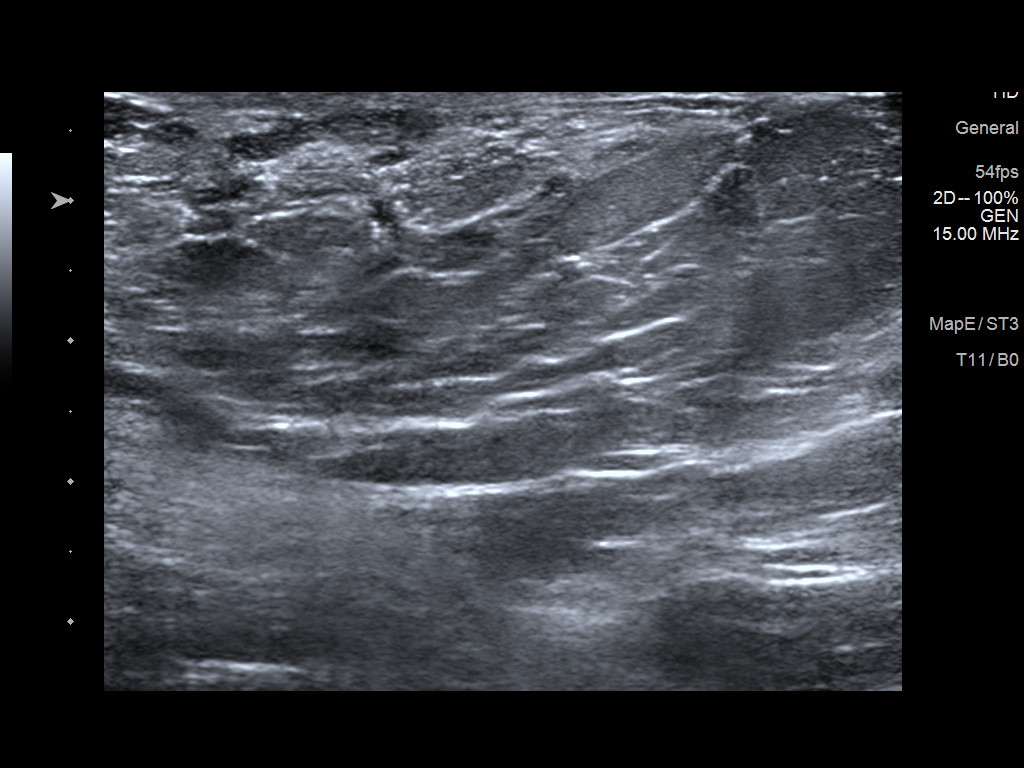
[im 4/4]
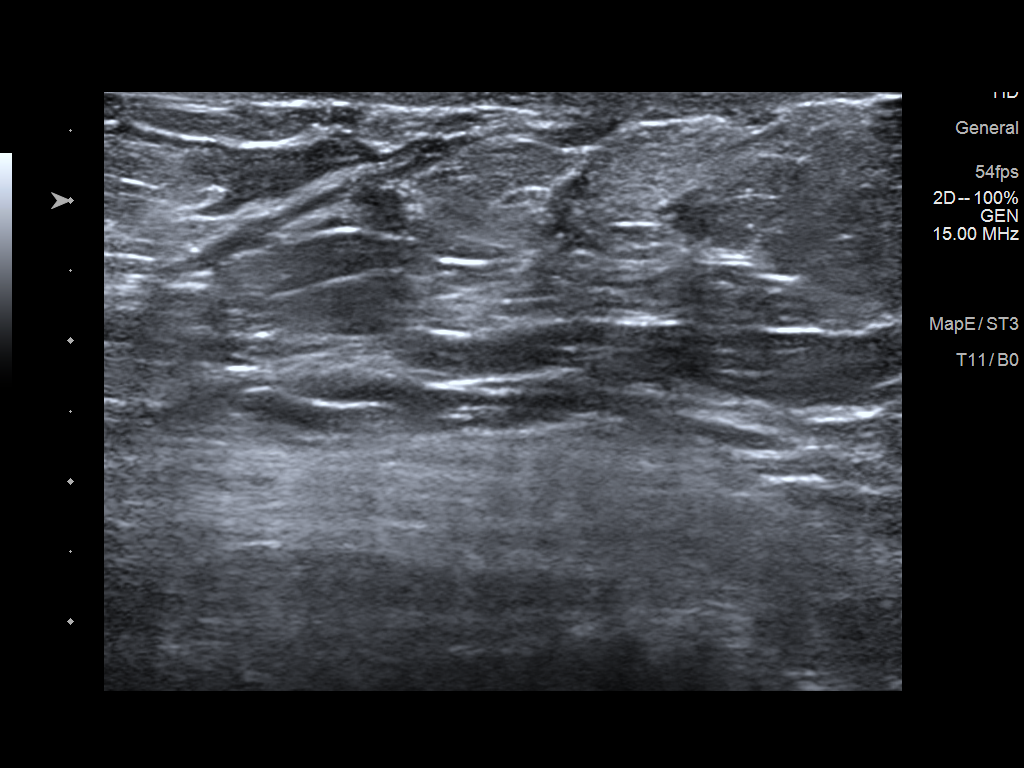

[4 of 4 positions shown; findings below may reference images not displayed]

FINDINGS: In the retroareolar right breast, there is a moderate amount of
flame shaped fibroglandular tissue consistent with benign
gynecomastia. Minimal gynecomastia is seen in the retroareolar left
breast. No suspicious calcifications, masses or areas of distortion
are seen in the bilateral breasts.

Mammographic images were processed with CAD.

On physical exam, no suspicious palpable lumps are identified in the
retroareolar right breast.

Targeted ultrasound is performed, showing scattered fibroglandular
tissue in the retroareolar right breast. No suspicious masses or
areas of shadowing are identified.
IMPRESSION: 1.  Benign right greater than left gynecomastia.

2.  No mammographic evidence of malignancy in the bilateral breasts.

RECOMMENDATION:
Clinical follow-up recommended for symptomatic benign gynecomastia.

I have discussed the findings and recommendations with the patient.
If applicable, a reminder letter will be sent to the patient
regarding the next appointment.

BI-RADS CATEGORY  2: Benign.

## 2021-02-17 IMAGING — MG DIGITAL DIAGNOSTIC BILAT W/ TOMO W/ CAD
6 of 10 series · 6 of 30 positions shown · non-contrast
Comparison: None.

ACR Breast Density Category a: The breast tissue is almost entirely
fatty.

CLINICAL DATA: 44-year-old male presenting for evaluation of pain
deep within the right breast since about Brandin or Go.

EXAM:
DIGITAL DIAGNOSTIC BILATERAL MAMMOGRAM WITH CAD AND TOMO
ULTRASOUND BILATERAL BREAST

[L MLO synth-2D]
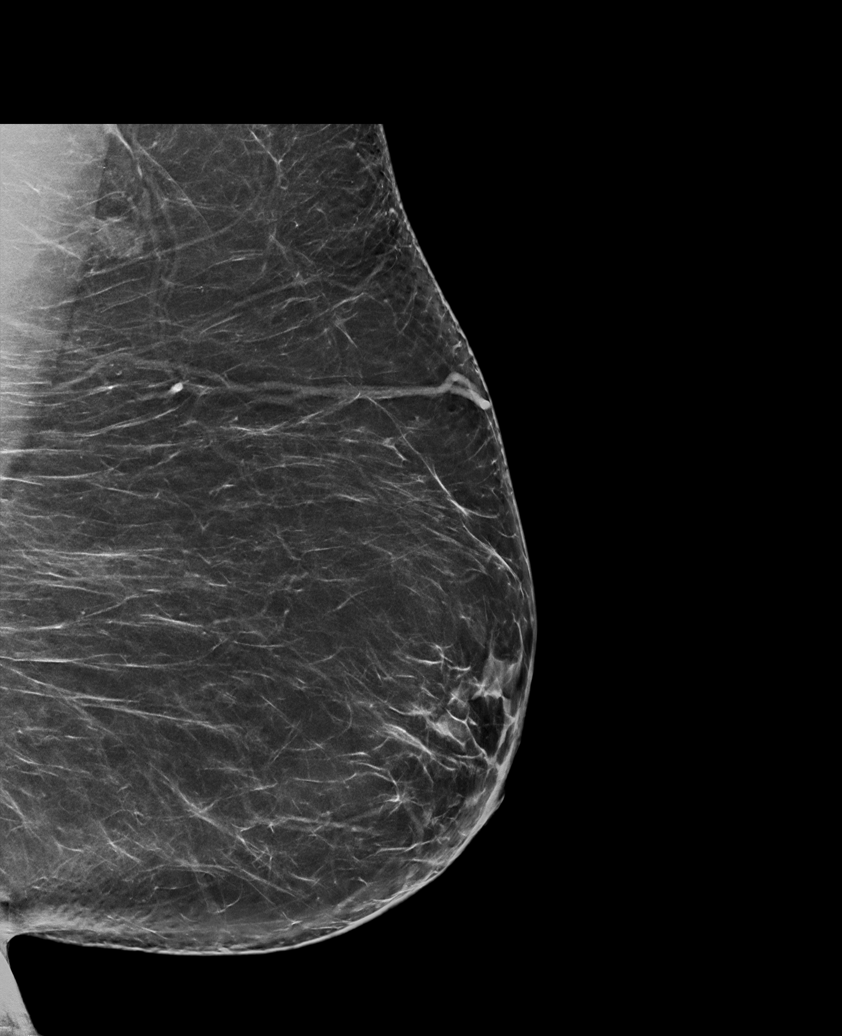

[R CC synth-2D (1 of 2)]
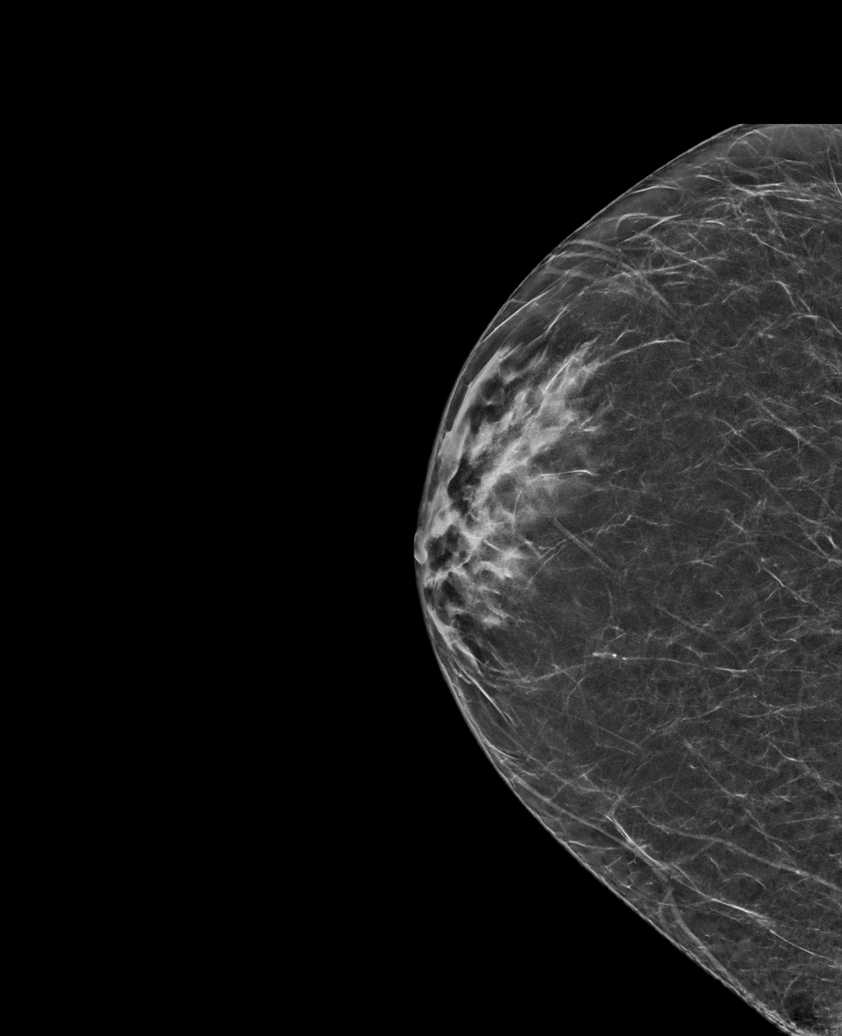

[L CC synth-2D]
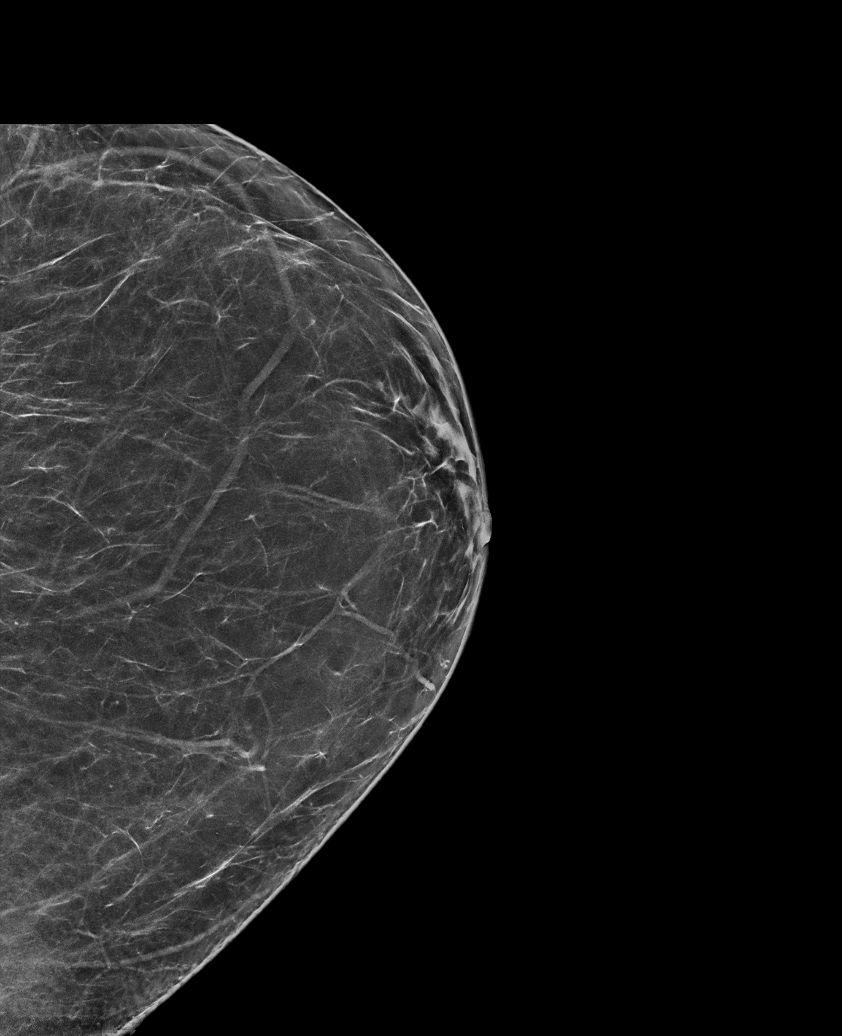

[R CC synth-2D (2 of 2)]
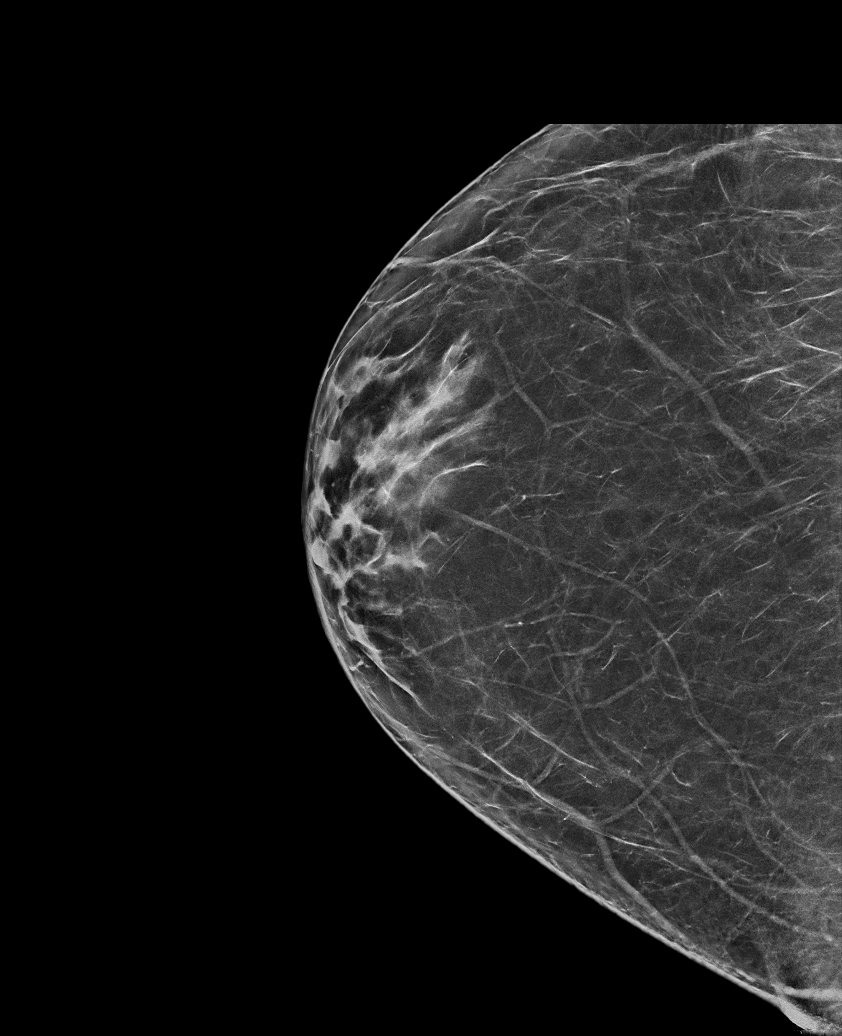

[R MLO synth-2D]
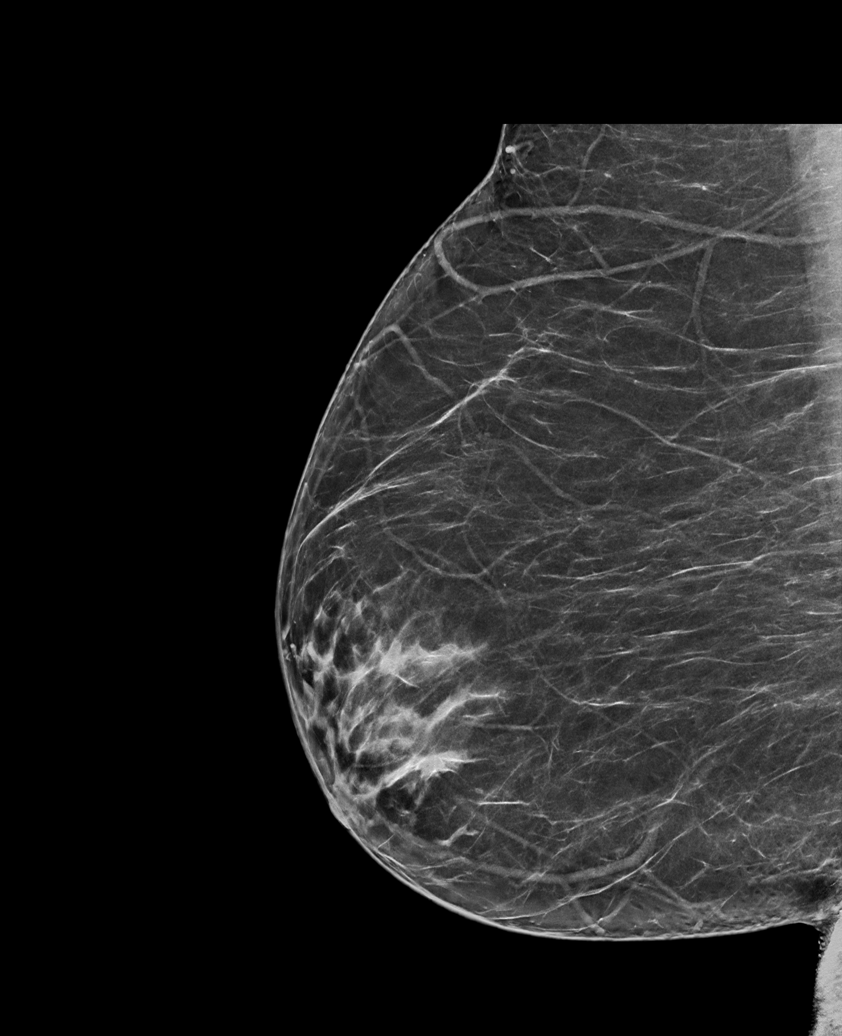

[R MLO tomo · tomo slice 39/77.0]
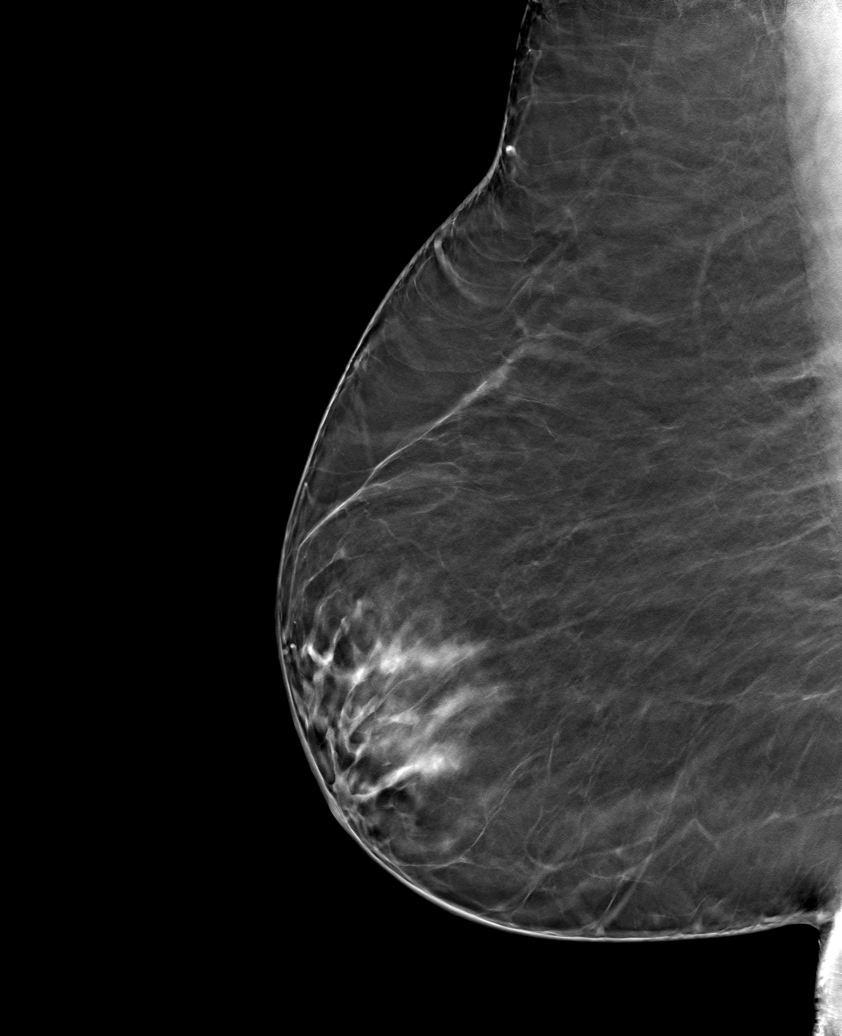

[6 of 30 positions shown; findings below may reference images not displayed]

FINDINGS: In the retroareolar right breast, there is a moderate amount of
flame shaped fibroglandular tissue consistent with benign
gynecomastia. Minimal gynecomastia is seen in the retroareolar left
breast. No suspicious calcifications, masses or areas of distortion
are seen in the bilateral breasts.

Mammographic images were processed with CAD.

On physical exam, no suspicious palpable lumps are identified in the
retroareolar right breast.

Targeted ultrasound is performed, showing scattered fibroglandular
tissue in the retroareolar right breast. No suspicious masses or
areas of shadowing are identified.
IMPRESSION: 1.  Benign right greater than left gynecomastia.

2.  No mammographic evidence of malignancy in the bilateral breasts.

RECOMMENDATION:
Clinical follow-up recommended for symptomatic benign gynecomastia.

I have discussed the findings and recommendations with the patient.
If applicable, a reminder letter will be sent to the patient
regarding the next appointment.

BI-RADS CATEGORY  2: Benign.

## 2021-02-18 MED ORDER — PREDNISONE 20 MG PO TABS: 40.0000 mg | ORAL_TABLET | Freq: Every day | ORAL | 0 refills | Status: DC

## 2021-02-18 NOTE — Addendum Note (Signed)
Addended by: Orland Mustard on: 02/18/2021 05:08 PM   Modules accepted: Orders

## 2021-02-18 NOTE — Addendum Note (Signed)
Addended by: Orland Mustard on: 02/18/2021 05:09 PM   Modules accepted: Orders

## 2021-02-19 ENCOUNTER — Ambulatory Visit
Admission: RE | Admit: 2021-02-19 | Discharge: 2021-02-19 | Disposition: A | Discharge status: Home / Self Care | Payer: BC Managed Care – PPO | Source: Ambulatory Visit | Attending: Internal Medicine | Admitting: Internal Medicine

## 2021-02-19 DIAGNOSIS — M25562 Pain in left knee: Secondary | ICD-10-CM

## 2021-02-19 DIAGNOSIS — M545 Low back pain, unspecified: Secondary | ICD-10-CM

## 2021-02-26 DIAGNOSIS — G8929 Other chronic pain: Secondary | ICD-10-CM | POA: Diagnosis not present

## 2021-02-26 DIAGNOSIS — M7918 Myalgia, other site: Secondary | ICD-10-CM | POA: Diagnosis not present

## 2021-02-26 DIAGNOSIS — M5441 Lumbago with sciatica, right side: Secondary | ICD-10-CM | POA: Diagnosis not present

## 2021-02-26 DIAGNOSIS — M5136 Other intervertebral disc degeneration, lumbar region: Secondary | ICD-10-CM | POA: Diagnosis not present

## 2021-04-01 ENCOUNTER — Ambulatory Visit (INDEPENDENT_AMBULATORY_CARE_PROVIDER_SITE_OTHER): Payer: BC Managed Care – PPO | Admitting: Family Medicine

## 2021-04-22 ENCOUNTER — Ambulatory Visit (INDEPENDENT_AMBULATORY_CARE_PROVIDER_SITE_OTHER): Payer: BC Managed Care – PPO | Admitting: Family Medicine

## 2021-06-16 DIAGNOSIS — M5441 Lumbago with sciatica, right side: Secondary | ICD-10-CM | POA: Diagnosis not present

## 2021-06-16 DIAGNOSIS — G8929 Other chronic pain: Secondary | ICD-10-CM | POA: Diagnosis not present

## 2021-06-16 DIAGNOSIS — M7918 Myalgia, other site: Secondary | ICD-10-CM | POA: Diagnosis not present

## 2021-06-16 DIAGNOSIS — M5136 Other intervertebral disc degeneration, lumbar region: Secondary | ICD-10-CM | POA: Diagnosis not present

## 2021-07-08 ENCOUNTER — Telehealth: Payer: BC Managed Care – PPO | Admitting: Physician Assistant

## 2021-07-08 DIAGNOSIS — U071 COVID-19: Secondary | ICD-10-CM | POA: Diagnosis not present

## 2021-07-08 MED ORDER — BENZONATATE 100 MG PO CAPS: 100.0000 mg | ORAL_CAPSULE | Freq: Three times a day (TID) | ORAL | 0 refills | Status: DC | PRN

## 2021-07-08 MED ORDER — NIRMATRELVIR/RITONAVIR (PAXLOVID)TABLET: 3.0000 | ORAL_TABLET | Freq: Two times a day (BID) | ORAL | 0 refills | Status: AC

## 2021-07-08 NOTE — Patient Instructions (Addendum)
?Francisco Hamilton, thank you for joining Leeanne Rio, PA-C for today's virtual visit.  While this provider is not your primary care provider (PCP), if your PCP is located in our provider database this encounter information will be shared with them immediately following your visit. ? ?Consent: ?(Patient) Francisco Hamilton provided verbal consent for this virtual visit at the beginning of the encounter. ? ?Current Medications: ? ?Current Outpatient Medications:  ?  acetaminophen (TYLENOL) 500 MG tablet, Take 1,000 mg by mouth daily as needed., Disp: , Rfl:  ?  Cholecalciferol 1.25 MG (50000 UT) capsule, Take 1 capsule (50,000 Units total) by mouth once a week. d3, Disp: 13 capsule, Rfl: 1 ?  ciclopirox (PENLAC) 8 % solution, Apply topically at bedtime. Apply over nail and surrounding skin. Apply daily over previous coat. After seven (7) days, may remove with alcohol and continue cycle., Disp: 6.6 mL, Rfl: 3 ?  cyclobenzaprine (FLEXERIL) 5 MG tablet, Take 1 tablet (5 mg total) by mouth at bedtime as needed for muscle spasms., Disp: 30 tablet, Rfl: 5 ?  ipratropium (ATROVENT) 0.06 % nasal spray, Place 2 sprays into both nostrils 3 (three) times daily. (Patient not taking: Reported on 02/13/2021), Disp: , Rfl:  ?  ketoconazole (NIZORAL) 2 % cream, Apply 1 application topically daily. To back and chest (Patient not taking: Reported on 02/13/2021), Disp: 60 g, Rfl: 22 ?  meclizine (ANTIVERT) 25 MG tablet, Take 0.5-1 tablets (12.5-25 mg total) by mouth 2 (two) times daily as needed for dizziness. (Patient not taking: Reported on 02/13/2021), Disp: 60 tablet, Rfl: 11 ?  meloxicam (MOBIC) 15 MG tablet, Take 1 tablet (15 mg total) by mouth daily., Disp: 30 tablet, Rfl: 1 ?  mupirocin ointment (BACTROBAN) 2 %, Apply 1 application topically 2 (two) times daily. (Patient not taking: Reported on 02/13/2021), Disp: 30 g, Rfl: 0 ?  olmesartan-hydrochlorothiazide (BENICAR HCT) 20-12.5 MG tablet, TAKE 1 TABLET BY  MOUTH DAILY IN THE MORNING. IF BLOOD PRESSURE NOT AT GOAL<130/<80 AFTER 2 WEEKS, INCREASE TO 2 TABLETS DAILY IN THE MORNING, Disp: 90 tablet, Rfl: 3 ?  phentermine (ADIPEX-P) 37.5 MG tablet, Take 1 tablet (37.5 mg total) by mouth daily before breakfast. 1/2 (Patient not taking: Reported on 02/13/2021), Disp: 60 tablet, Rfl: 0 ?  phentermine (ADIPEX-P) 37.5 MG tablet, Take 1 tablet (37.5 mg total) by mouth daily before breakfast. Rx 2/2 (Patient not taking: Reported on 02/13/2021), Disp: 60 tablet, Rfl: 0 ?  predniSONE (DELTASONE) 20 MG tablet, Take 2 tablets (40 mg total) by mouth daily with breakfast. X5-7 days, Disp: 14 tablet, Rfl: 0 ?  triamcinolone cream (KENALOG) 0.1 %, Apply 1 application topically 2 (two) times daily. Back right side, Disp: 454 g, Rfl: 0  ? ?Medications ordered in this encounter:  ?No orders of the defined types were placed in this encounter. ?  ? ?*If you need refills on other medications prior to your next appointment, please contact your pharmacy* ? ?Follow-Up: ?Call back or seek an in-person evaluation if the symptoms worsen or if the condition fails to improve as anticipated. ? ?Other Instructions ?Please keep well-hydrated and get plenty of rest. ?Start a saline nasal rinse to flush out your nasal passages. ?You can use plain Mucinex to help thin congestion. ?If you have a humidifier, running in the bedroom at night. ?I want you to start OTC vitamin D3 1000 units daily, vitamin C 1000 mg daily, and a zinc supplement. ?Please take prescribed medications as directed. ? ?You  have been enrolled in a MyChart symptom monitoring program. ?Please answer these questions daily so we can keep track of how you are doing. ? ?You were to quarantine for 5 days from onset of your symptoms.  After day 5, if you have had no fever and you are feeling better, you can end quarantine but need to mask for an additional 5 days. ?After day 5 if you have a fever or are having significant symptoms, please  quarantine for full 10 days. ? ?If you note any worsening of symptoms, any significant shortness of breath or any chest pain, please seek ER evaluation ASAP.  Please do not delay care! ? ?COVID-19: What to Do if You Are Sick ?If you test positive and are an older adult or someone who is at high risk of getting very sick from COVID-19, treatment may be available. Contact a healthcare provider right away after a positive test to determine if you are eligible, even if your symptoms are mild right now. You can also visit a Test to Treat location and, if eligible, receive a prescription from a provider. Don't delay: Treatment must be started within the first few days to be effective. ?If you have a fever, cough, or other symptoms, you might have COVID-19. Most people have mild illness and are able to recover at home. If you are sick: ?Keep track of your symptoms. ?If you have an emergency warning sign (including trouble breathing), call 911. ?Steps to help prevent the spread of COVID-19 if you are sick ?If you are sick with COVID-19 or think you might have COVID-19, follow the steps below to care for yourself and to help protect other people in your home and community. ?Stay home except to get medical care ?Stay home. Most people with COVID-19 have mild illness and can recover at home without medical care. Do not leave your home, except to get medical care. Do not visit public areas and do not go to places where you are unable to wear a mask. ?Take care of yourself. Get rest and stay hydrated. Take over-the-counter medicines, such as acetaminophen, to help you feel better. ?Stay in touch with your doctor. Call before you get medical care. Be sure to get care if you have trouble breathing, or have any other emergency warning signs, or if you think it is an emergency. ?Avoid public transportation, ride-sharing, or taxis if possible. ?Get tested ?If you have symptoms of COVID-19, get tested. While waiting for test results,  stay away from others, including staying apart from those living in your household. ?Get tested as soon as possible after your symptoms start. Treatments may be available for people with COVID-19 who are at risk for becoming very sick. Don't delay: Treatment must be started early to be effective--some treatments must begin within 5 days of your first symptoms. Contact your healthcare provider right away if your test result is positive to determine if you are eligible. ?Self-tests are one of several options for testing for the virus that causes COVID-19 and may be more convenient than laboratory-based tests and point-of-care tests. Ask your healthcare provider or your local health department if you need help interpreting your test results. ?You can visit your state, tribal, local, and territorial health department's website to look for the latest local information on testing sites. ?Separate yourself from other people ?As much as possible, stay in a specific room and away from other people and pets in your home. If possible, you should use a separate bathroom.  If you need to be around other people or animals in or outside of the home, wear a well-fitting mask. ?Tell your close contacts that they may have been exposed to COVID-19. An infected person can spread COVID-19 starting 48 hours (or 2 days) before the person has any symptoms or tests positive. By letting your close contacts know they may have been exposed to COVID-19, you are helping to protect everyone. ?See COVID-19 and Animals if you have questions about pets. ?If you are diagnosed with COVID-19, someone from the health department may call you. Answer the call to slow the spread. ?Monitor your symptoms ?Symptoms of COVID-19 include fever, cough, or other symptoms. ?Follow care instructions from your healthcare provider and local health department. Your local health authorities may give instructions on checking your symptoms and reporting information. ?When  to seek emergency medical attention ?Look for emergency warning signs* for COVID-19. If someone is showing any of these signs, seek emergency medical care immediately: ?Trouble breathing ?Persistent pain or

## 2021-07-08 NOTE — Progress Notes (Signed)
?Virtual Visit Consent  ? ?Francisco Hamilton, you are scheduled for a virtual visit with a [facility] provider today. Just as with appointments in the office, your consent must be obtained to participate. Your consent will be active for this visit and any virtual visit you may have with one of our providers in the next 365 days. If you have a MyChart account, a copy of this consent can be sent to you electronically. ? ?As this is a virtual visit, video technology does not allow for your provider to perform a traditional examination. This may limit your provider's ability to fully assess your condition. If your provider identifies any concerns that need to be evaluated in person or the need to arrange testing (such as labs, EKG, etc.), we will make arrangements to do so. Although advances in technology are sophisticated, we cannot ensure that it will always work on either your end or our end. If the connection with a video visit is poor, the visit may have to be switched to a telephone visit. With either a video or telephone visit, we are not always able to ensure that we have a secure connection. ? ?By engaging in this virtual visit, you consent to the provision of healthcare and authorize for your insurance to be billed (if applicable) for the services provided during this visit. Depending on your insurance coverage, you may receive a charge related to this service. ? ?I need to obtain your verbal consent now. Are you willing to proceed with your visit today? Aceson Labell Natter Hamilton has provided verbal consent on 07/08/2021 for a virtual visit (video or telephone). Leeanne Rio, PA-C ? ?Date: 07/08/2021 8:55 AM ? ?Virtual Visit via Video Note  ? ?I, Leeanne Rio, connected with  Francisco Hamilton  ([id number], [birth year]) on 07/08/21 at  9:00 AM EDT by a video-enabled telemedicine application and verified that I am speaking with the correct person using two identifiers. ? ?Location: ?Patient:  Virtual Visit Location Patient: Home ?Provider: Virtual Visit Location Provider: Home Office ?  ?I discussed the limitations of evaluation and management by telemedicine and the availability of in person appointments. The patient expressed understanding and agreed to proceed.   ? ?History of Present Illness: ?Francisco Hamilton is a 46 y.o. who identifies as a male who was assigned male at birth, and is being seen today for COVID-19. Endorses symptoms starting last night with fever (101), cough and congestion, head congestion, sweats and fatigue. Denies chest pain, SOB, chills. Denies loss of taste or smell. Denies GI symptoms. Recently came back from a cruise and notes friends who went with them having COVID.  Has not had COVID before. Had initial COVID vaccines. No boosters. Has taken Tylenol and Mucinex for symptoms.  ? ?HPI: HPI  ?Problems:  ?Patient Active Problem List  ? Diagnosis Date Noted  ? Wears glasses 08/06/2020  ? Tinea versicolor 08/06/2020  ? Annual physical exam 11/14/2019  ? Gynecomastia 11/14/2019  ? Obesity, diabetes, and hypertension syndrome ([place]) 11/14/2019  ? Hypertension associated with diabetes ([place]) 11/14/2019  ? Type 2 diabetes mellitus without complication, without long-term current use of insulin ([place]) 03/08/2019  ? Low testosterone in male 03/08/2019  ? Hypogonadism in male 12/05/2018  ? Low back pain 05/30/2018  ? Vitamin D deficiency 04/14/2018  ? Essential hypertension 04/04/2018  ? Morbid obesity with BMI of 50.0-59.9, adult ([place]) 04/04/2018  ? Varicose veins of both lower extremities 04/04/2018  ? Snoring 04/04/2018  ?  Acute left ankle pain 04/04/2018  ? Acute sinusitis 07/14/2007  ? Eustachian tube dysfunction 07/14/2007  ? Malaise and fatigue 03/08/2007  ? Dysuria 07/14/2006  ? Urinary tract infectious disease 07/14/2006  ? Pain in elbow 04/18/2006  ? Carbuncle of skin and/or subcutaneous tissue 03/04/2006  ? Elevated blood-pressure reading without diagnosis of hypertension  03/04/2006  ?  ?Allergies:  ?Allergies  ?Allergen Reactions  ? Nsaids   ?  Hives ?  ? ?Medications:  ?Current Outpatient Medications:  ?  benzonatate (TESSALON) 100 MG capsule, Take 1 capsule (100 mg total) by mouth 3 (three) times daily as needed for cough., Disp: 30 capsule, Rfl: 0 ?  gabapentin (NEURONTIN) 300 MG capsule, Take by mouth., Disp: , Rfl:  ?  acetaminophen (TYLENOL) 500 MG tablet, Take 1,000 mg by mouth daily as needed., Disp: , Rfl:  ?  Cholecalciferol 1.25 MG (50000 UT) capsule, Take 1 capsule (50,000 Units total) by mouth once a week. d3, Disp: 13 capsule, Rfl: 1 ?  ciclopirox (PENLAC) 8 % solution, Apply topically at bedtime. Apply over nail and surrounding skin. Apply daily over previous coat. After seven (7) days, may remove with alcohol and continue cycle., Disp: 6.6 mL, Rfl: 3 ?  cyclobenzaprine (FLEXERIL) 5 MG tablet, Take 1 tablet (5 mg total) by mouth at bedtime as needed for muscle spasms., Disp: 30 tablet, Rfl: 5 ?  ipratropium (ATROVENT) 0.06 % nasal spray, Place 2 sprays into both nostrils 3 (three) times daily. (Patient not taking: Reported on 02/13/2021), Disp: , Rfl:  ?  meloxicam (MOBIC) 15 MG tablet, Take 1 tablet (15 mg total) by mouth daily., Disp: 30 tablet, Rfl: 1 ?  olmesartan-hydrochlorothiazide (BENICAR HCT) 20-12.5 MG tablet, TAKE 1 TABLET BY MOUTH DAILY IN THE MORNING. IF BLOOD PRESSURE NOT AT GOAL<130/<80 AFTER 2 WEEKS, INCREASE TO 2 TABLETS DAILY IN THE MORNING, Disp: 90 tablet, Rfl: 3 ? ?Observations/Objective: ?Patient is well-developed, well-nourished in no acute distress.  ?Resting comfortably at home.  ?Head is normocephalic, atraumatic.  ?No labored breathing. ?Speech is clear and coherent with logical content.  ?Patient is alert and oriented at baseline.  ? ?Assessment and Plan: ?1. COVID-19 ?- benzonatate (TESSALON) 100 MG capsule; Take 1 capsule (100 mg total) by mouth 3 (three) times daily as needed for cough.  Dispense: 30 capsule; Refill: 0 ?- MyChart  COVID-19 home monitoring program; Future ? ?Patient with multiple risk factors for complicated course of illness. Discussed risks/benefits of antiviral medications including most common potential ADRs. Patient voiced understanding and would like to proceed with antiviral medication. They are candidate for Paxlovid as GFR at 103 on last check a few month ago. Rx sent to pharmacy. Supportive measures, OTC medications and vitamin regimen reviewed. Tessalon per orders. Patient has been enrolled in a MyChart COVID symptom monitoring program. Samule Dry reviewed in detail. Strict ER precautions discussed with patient.  ? ? ?Follow Up Instructions: ?I discussed the assessment and treatment plan with the patient. The patient was provided an opportunity to ask questions and all were answered. The patient agreed with the plan and demonstrated an understanding of the instructions.  A copy of instructions were sent to the patient via MyChart unless otherwise noted below.  ? ?The patient was advised to call back or seek an in-person evaluation if the symptoms worsen or if the condition fails to improve as anticipated. ? ?Time:  ?I spent 10 minutes with the patient via telehealth technology discussing the above problems/concerns.   ? ?Leeanne Rio, PA-C ?

## 2021-07-13 ENCOUNTER — Other Ambulatory Visit: Payer: Self-pay | Admitting: Internal Medicine

## 2021-07-13 DIAGNOSIS — I10 Essential (primary) hypertension: Secondary | ICD-10-CM

## 2021-07-15 DIAGNOSIS — I10 Essential (primary) hypertension: Secondary | ICD-10-CM | POA: Diagnosis not present

## 2021-07-15 DIAGNOSIS — Z1389 Encounter for screening for other disorder: Secondary | ICD-10-CM | POA: Diagnosis not present

## 2021-07-15 DIAGNOSIS — R7303 Prediabetes: Secondary | ICD-10-CM | POA: Diagnosis not present

## 2021-07-15 DIAGNOSIS — G5701 Lesion of sciatic nerve, right lower limb: Secondary | ICD-10-CM | POA: Diagnosis not present

## 2021-07-15 DIAGNOSIS — K7581 Nonalcoholic steatohepatitis (NASH): Secondary | ICD-10-CM | POA: Diagnosis not present

## 2021-07-15 DIAGNOSIS — R0602 Shortness of breath: Secondary | ICD-10-CM | POA: Diagnosis not present

## 2021-07-15 DIAGNOSIS — R5383 Other fatigue: Secondary | ICD-10-CM | POA: Diagnosis not present

## 2021-07-20 ENCOUNTER — Encounter (INDEPENDENT_AMBULATORY_CARE_PROVIDER_SITE_OTHER): Payer: Self-pay

## 2021-07-29 DIAGNOSIS — I10 Essential (primary) hypertension: Secondary | ICD-10-CM | POA: Diagnosis not present

## 2021-07-29 DIAGNOSIS — R632 Polyphagia: Secondary | ICD-10-CM | POA: Diagnosis not present

## 2021-07-29 DIAGNOSIS — K7581 Nonalcoholic steatohepatitis (NASH): Secondary | ICD-10-CM | POA: Diagnosis not present

## 2021-07-29 DIAGNOSIS — E118 Type 2 diabetes mellitus with unspecified complications: Secondary | ICD-10-CM | POA: Diagnosis not present

## 2021-08-14 ENCOUNTER — Ambulatory Visit: Payer: BC Managed Care – PPO | Admitting: Internal Medicine

## 2021-08-14 ENCOUNTER — Encounter: Payer: Self-pay | Admitting: Internal Medicine

## 2021-08-14 VITALS — BP 130/80 | HR 75 | Temp 98.0°F | Resp 14 | Ht 73.0 in | Wt 367.2 lb

## 2021-08-14 DIAGNOSIS — I10 Essential (primary) hypertension: Secondary | ICD-10-CM

## 2021-08-14 DIAGNOSIS — Z1211 Encounter for screening for malignant neoplasm of colon: Secondary | ICD-10-CM | POA: Diagnosis not present

## 2021-08-14 DIAGNOSIS — E559 Vitamin D deficiency, unspecified: Secondary | ICD-10-CM

## 2021-08-14 DIAGNOSIS — M545 Low back pain, unspecified: Secondary | ICD-10-CM

## 2021-08-14 DIAGNOSIS — G5700 Lesion of sciatic nerve, unspecified lower limb: Secondary | ICD-10-CM

## 2021-08-14 DIAGNOSIS — Z6841 Body Mass Index (BMI) 40.0 and over, adult: Secondary | ICD-10-CM

## 2021-08-14 DIAGNOSIS — M62838 Other muscle spasm: Secondary | ICD-10-CM

## 2021-08-14 DIAGNOSIS — R7303 Prediabetes: Secondary | ICD-10-CM

## 2021-08-14 DIAGNOSIS — G8929 Other chronic pain: Secondary | ICD-10-CM

## 2021-08-14 DIAGNOSIS — G5701 Lesion of sciatic nerve, right lower limb: Secondary | ICD-10-CM | POA: Insufficient documentation

## 2021-08-14 MED ORDER — CYCLOBENZAPRINE HCL 5 MG PO TABS: 5.0000 mg | ORAL_TABLET | Freq: Every evening | ORAL | 5 refills | Status: DC | PRN

## 2021-08-14 MED ORDER — CHOLECALCIFEROL 1.25 MG (50000 UT) PO CAPS: 50000.0000 [IU] | ORAL_CAPSULE | ORAL | 0 refills | Status: DC

## 2021-08-14 NOTE — Patient Instructions (Addendum)
Aspercream with lidocaine or voltaren gel   Dr. Evette Doffing schooler  Phone Fax E-mail Address  [phone] Not available [address]   [place] [address]     Specialties     Gastroenterology            Piriformis Syndrome  Piriformis syndrome is a condition that can cause pain and numbness in your buttocks and down the back of your leg. Piriformis syndrome happens when the small muscle that connects the base of your spine to your hip (piriformis muscle) presses on the nerve that runs down the back of your leg (sciatic nerve). The piriformis muscle helps your hip rotate and helps to bring your leg back and out. It also helps shift your weight to keep you stable while you are walking. The sciatic nerve runs under or through the piriformis muscle. Damage to the piriformis muscle can cause spasms that put pressure on the nerve below. This causes pain and discomfort while sitting and moving. The pain may feel as if it begins in the buttock and spreads (radiates) down your hip and thigh. What are the causes? This condition is caused by pressure on the sciatic nerve from the piriformis muscle. The piriformis muscle can get irritated with overuse, especially if other hip muscles are weak and the piriformis muscle has to do extra work. Piriformis syndrome can also occur after an injury, like a fall onto your buttocks. What increases the risk? You are more likely to develop this condition if you: Are a woman. Sit for long periods of time. Are a cyclist. Have weak buttocks muscles (gluteal muscles). What are the signs or symptoms? Symptoms of this condition include: Pain, tingling, or numbness that starts in the buttock and runs down the back of your leg (sciatica). Pain in the groin or thigh area. Your symptoms may get worse: The longer you sit. When you walk, run, or climb stairs. When straining to have a bowel movement. How is this diagnosed? This condition  is diagnosed based on your symptoms, medical history, and physical exam. During the exam, your health care provider may: Move your leg into different positions to check for pain. Press on the muscles of your hip and buttock to see if that increases your symptoms. You may also have tests, including: Imaging tests such as X-rays, CT, MRI, or ultrasound. Electromyogram (EMG). This test measures electrical signals sent by your nerves into the muscles. Nerve conduction study. This test measures how well electrical signals pass through your nerves. How is this treated? This condition may be treated by: Stopping all activities that cause pain or make your condition worse. Applying ice or using heat therapy. Taking medicines to reduce pain and swelling. Taking a muscle relaxer (muscle relaxant) to stop muscle spasms. Doing range-of-motion and strengthening exercises (physical therapy) as told by your health care provider. Having massage, acupuncture, or local electrical stimulation (transcutaneous electrical nerve stimulation, TENS). Getting an injection of medicine in the piriformis muscle. Your health care provider will choose the medicine based on your condition. He or she may inject: An anti-inflammatory medicine (steroid) to reduce swelling. A numbing medicine (local anesthetic) to block the pain. Botulinum toxin. The toxin blocks nerve impulses to specific muscles to reduce muscle tension. In rare cases, you may need surgery to cut the muscle and release pressure on the nerve if other treatments do not work. Follow these instructions at home: Activity Do not sit for long periods. Get up  and walk around every 20 minutes or as often as told by your health care provider. When driving long distances, make sure to take frequent stops to get up and stretch. Use a cushion when you sit on hard surfaces. Do exercises as told by your health care provider. Return to your normal activities as told by  your health care provider. Ask your health care provider what activities are safe for you. Managing pain, stiffness, and swelling     If directed, apply heat to the area as often as told by your health care provider. Use the heat source that your health care provider recommends, such as a moist heat pack or a heating pad. Place a towel between your skin and the heat source. Leave the heat on for 20-30 minutes. Remove the heat if your skin turns bright red. This is especially important if you are unable to feel pain, heat, or cold. You have a greater risk of getting burned. If directed, put ice on the injured area. To do this: Put ice in a plastic bag. Place a towel between your skin and the bag. Leave the ice on for 20 minutes, 2-3 times a day. Remove the ice if your skin turns bright red. This is very important. If you cannot feel pain, heat, or cold, you have a greater risk of damage to the area. General instructions Take over-the-counter and prescription medicines only as told by your health care provider. Ask your health care provider if the medicine prescribed to you requires you to avoid driving or using machinery. You may need to take these actions to prevent or treat constipation: Drink enough fluid to keep your urine pale yellow. Take over-the-counter or prescription medicines. Eat foods that are high in fiber, such as beans, whole grains, and fresh fruits and vegetables. Limit foods that are high in fat and processed sugars, such as fried or sweet foods. Keep all follow-up visits. This is important. How is this prevented? Do not sit for longer than 20 minutes at a time. When you sit, choose padded surfaces. Warm up and stretch before being active. Cool down and stretch after being active. Contact a health care provider if: Your pain and stiffness continue or get worse. Your leg or hip becomes weak. You have changes in your bowel function or bladder  function. Summary Piriformis syndrome is a condition that can cause pain, tingling, and numbness in your buttocks and down the back of your leg. You may try applying heat or ice to relieve the pain. Do not sit for long periods. Get up and walk around every 20 minutes or as often as told by your health care provider. This information is not intended to replace advice given to you by your health care provider. Make sure you discuss any questions you have with your health care provider. Document Revised: 08/11/2020 Document Reviewed: 08/11/2020 Elsevier Patient Education  [place].  Piriformis Syndrome Rehab Ask your health care provider which exercises are safe for you. Do exercises exactly as told by your health care provider and adjust them as directed. It is normal to feel mild stretching, pulling, tightness, or discomfort as you do these exercises. Stop right away if you feel sudden pain or your pain gets worse. Do not begin these exercises until told by your health care provider. Stretching and range-of-motion exercises These exercises warm up your muscles and joints and improve the movement and flexibility of your hip and pelvis. The exercises also help to relieve  pain, numbness, and tingling. Nerve root  Sit on a firm surface that is high enough that you can swing your left / right foot freely. Place a folded towel under your left / right thigh. This is optional. Drop your head forward and round your back. While you keep your left / right foot relaxed, slowly straighten your left / right knee until you feel a slight pull behind your knee or calf. If your leg is fully extended and you still do not feel a pull, slowly tilt your foot and toes toward you. Hold this position for __________ seconds. Slowly return your knee to its starting position. Hip rotation This is an exercise in which you lie on your back and stretch the muscles that rotate your hip (hip rotators) to stretch your  buttocks. Lie on your back on a firm surface. Pull your left / right knee toward your same shoulder with your left / right hand until your knee is pointing toward the ceiling. Hold your left / right ankle with your other hand. Keeping your knee steady, gently pull your left / right ankle toward your other shoulder until you feel a stretch in your buttocks. Hold this position for __________ seconds. Repeat __________ times. Complete this exercise __________ times a day. Hip extensor This is an exercise in which you lie on your back and pull your knee to your chest. Lie on your back on a firm surface. Both of your legs should be straight. Pull your left / right knee to your chest. Hold your leg in this position by holding on to the back of your thigh or the front of your knee. Hold this position for __________ seconds. Slowly return to the starting position. Repeat __________ times. Complete this exercise __________ times a day. Strengthening exercises These exercises build strength and endurance in your hip and thigh muscles. Endurance is the ability to use your muscles for a long time, even after they get tired. Straight leg raises, side-lying This exercise strengthens the muscles that rotate the leg at the hip and move it away from your body (hip abductors). Lie on your side with your left / right leg in the top position. Lie so your head, shoulder, knee, and hip line up. Bend your bottom knee to help you balance. Lift your top leg 4-6 inches (10-15 cm) while keeping your toes pointed straight ahead. Hold this position for __________ seconds. Slowly lower your leg to the starting position. Let your muscles relax completely after each repetition. Repeat __________ times. Complete this exercise __________ times a day. Hip abduction and rotation This is sometimes called quadruped (on hands and knees) exercises. Get on your hands and knees on a firm, lightly padded surface. Your hands should be  directly below your shoulders, and your knees should be directly below your hips. Lift your left / right knee out to the side. Keep your knee bent. Do not twist your body. Hold this position for __________ seconds. Slowly lower your leg. Repeat __________ times. Complete this exercise __________ times a day. Straight leg raises, prone This exercise stretches the muscles that move the hips (hip extensors). Lie on your abdomen on a firm surface (prone position). Tense the muscles in your buttocks and lift your left / right leg about 4 inches (10 cm). Keep your knee straight as you lift your leg. If you cannot lift your leg that high without arching your back, place a pillow under your hips. Hold this position for __________ seconds.  Slowly lower your leg to the starting position. Let your muscles relax completely after each repetition. Repeat __________ times. Complete this exercise __________ times a day. This information is not intended to replace advice given to you by your health care provider. Make sure you discuss any questions you have with your health care provider. Document Revised: 08/19/2020 Document Reviewed: 08/19/2020 Elsevier Patient Education  [place].

## 2021-08-14 NOTE — Progress Notes (Signed)
Chief Complaint  Patient presents with   Follow-up    6 mon, denies any concerns or pain.   F/u  1. Htn controlled benicar 20-12.5 mg qd had labd 07/15/21 Dr. Juleen China wt loss [facility] and [facility]bc nl A1c 6.0 insulin nl 16.8 total cholesterol 149, trigs 109, ldl 98 and hdl 31 tsh 1.34  2 h/o right piriformis steroid shot x 2 04/2021 and 05/2021 and doing better  3. Obesity will start mounjaro 2.5 on adipex per Dr. Briscoe Deutscher [facility] wt loss for now     Review of Systems  Constitutional:  Negative for weight loss.  HENT:  Negative for hearing loss.   Eyes:  Negative for blurred vision.  Respiratory:  Negative for shortness of breath.   Cardiovascular:  Negative for chest pain.  Gastrointestinal:  Negative for abdominal pain and blood in stool.  Genitourinary:  Negative for dysuria.  Musculoskeletal:  Negative for falls and joint pain.  Skin:  Negative for rash.  Neurological:  Negative for headaches.  Psychiatric/Behavioral:  Negative for depression.    Past Medical History:  Diagnosis Date   Allergy    COVID-19    06/2021 cruise   History of chicken pox    Hypertension    tx'ed in 20s off meds since    Past Surgical History:  Procedure Laterality Date   left knee arthroscopy     high school menscus issue   Family History  Problem Relation Age of Onset   Diabetes Father    Heart disease Sister        valvular heart disease   Diabetes Paternal Grandmother    Hypertension Paternal Grandmother    Heart disease Paternal Grandmother        valvular heart disease died in late 25s    Multiple myeloma Paternal Grandmother    Diabetes Paternal Grandfather    Hypertension Paternal Grandfather    Social History   Socioeconomic History   Marital status: Married    Spouse name: Not on file   Number of children: Not on file   Years of education: Not on file   Highest education level: Not on file  Occupational History   Not on file  Tobacco Use   Smoking status: Never   Smokeless  tobacco: Never  Substance and Sexual Activity   Alcohol use: Yes   Drug use: Not Currently   Sexual activity: Yes  Other Topics Concern   Not on file  Social History Narrative   Married to [place]   From [place]   2 kids age 48 and 57 as of 07/2020, 2 sons 16 and 42 08/04/20    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   gabapentin (NEURONTIN) 300 MG capsule Take by mouth.   ipratropium (ATROVENT) 0.06 % nasal spray Place 2 sprays into both nostrils 3 (three) times daily.   olmesartan-hydrochlorothiazide (BENICAR HCT) 20-12.5 MG tablet TAKE 1 TABLET BY MOUTH DAILY IN THE MORNING. IF BLOOD PRESSURE NOT AT  GOAL< 130,<80 AFTER 2 WEEKS, INCREASE TO 2 TABLETS DAILY IN THE MORNING   phentermine (ADIPEX-P) 37.5 MG tablet Take 37.5 mg by mouth daily before breakfast. Dr. Juleen China   [DISCONTINUED] Cholecalciferol 1.25 MG (50000 UT) capsule Take 1 capsule (50,000 Units total) by mouth once a week. d3   [DISCONTINUED] cyclobenzaprine (FLEXERIL) 5 MG tablet Take 1 tablet (5 mg total) by mouth at bedtime as needed for muscle spasms.   Allergies  Allergen Reactions   Nsaids     Hives    No results found for this or any previous visit (from the past 2160 hour(s)). Objective  Body mass index is 48.45 kg/m. Wt Readings from Last 3 Encounters:  08/14/21 (!) 367 lb 3.2 oz (166.6 kg)  02/13/21 (!) 367 lb 12.8 oz (166.8 kg)  08/06/20 (!) 380 lb (172.4 kg)   Temp Readings from Last 3 Encounters:  08/14/21 98 F (36.7 C) (Oral)  02/13/21 97.6 F  (36.4 C) (Temporal)  08/06/20 97.8 F (36.6 C) (Oral)   BP Readings from Last 3 Encounters:  08/14/21 130/80  02/13/21 (!) 144/90  08/06/20 138/90   Pulse Readings from Last 3 Encounters:  08/14/21 75  02/13/21 92  08/06/20 88    Physical Exam Vitals and nursing note reviewed.  Constitutional:      Appearance: Normal appearance. He is well-developed and well-groomed.  HENT:     Head: Normocephalic and atraumatic.  Eyes:     Conjunctiva/sclera: Conjunctivae normal.     Pupils: Pupils are equal, round, and reactive to light.  Cardiovascular:     Rate and Rhythm: Normal rate and regular rhythm.     Heart sounds: Normal heart sounds.  Pulmonary:     Effort: Pulmonary effort is normal. No respiratory distress.     Breath sounds: Normal breath sounds.  Abdominal:     Tenderness: There is no abdominal tenderness.  Skin:    General: Skin is warm and moist.  Neurological:     General: No focal deficit present.     Mental Status: He is alert and oriented to person, place, and time. Mental status is at baseline.     Sensory: Sensation is intact.     Motor: Motor function is intact.     Coordination: Coordination is intact.     Gait: Gait is intact. Gait normal.  Psychiatric:        Attention and Perception: Attention and perception normal.        Mood and Affect: Mood and affect normal.        Speech: Speech normal.        Behavior: Behavior normal. Behavior is cooperative.        Thought Content: Thought content normal.        Cognition and Memory: Cognition and memory normal.        Judgment: Judgment normal.     Assessment  Plan  Essential hypertension  benicar 20-12.5 mg qd  Controlled   Vitamin D deficiency - Plan: Cholecalciferol 1.25 MG (50000 UT) capsule  Chronic right-sided low back pain without sciatica - Plan: cyclobenzaprine (FLEXERIL) 5 MG tablet  Muscle spasm - Plan: cyclobenzaprine (FLEXERIL) 5 MG tablet  Piriformis syndrome, unspecified  laterality Piriformis syndrome, right  BMI 45.0-49.9, adult (HCC) Prediabetes  On adipex 37.5 on pending mounjaro 2.5  HM Flu utd  tdap utd  Consider hepatitis B vaccine x 2 doses  MMR immune   2/2 covid shot pfizer 2nd 06/13/19  Consider prevnar 20 in  the future  Ask in future hep C  Eye exam had 2019  Dentist Dr. Carlyle Dolly eye    PCP Woody Seller saw 03/2017 Shelly Bombard  Vitamin D3 rec 2000 IU daily otc  PSA 11/09/19 0.2  colonoscopy consider age 85  -referred [facility]   rec healthy diet and exercise -considering wt loss clinic  Left me know know when ready [facility] referral Obesity Dr. Juleen China in [place]   Provider: Dr. Olivia Mackie McLean-Scocuzza-Internal Medicine

## 2021-08-24 ENCOUNTER — Other Ambulatory Visit: Payer: Self-pay

## 2021-09-02 ENCOUNTER — Encounter: Payer: Self-pay | Admitting: Internal Medicine

## 2021-09-08 ENCOUNTER — Other Ambulatory Visit: Payer: Self-pay

## 2021-09-08 MED ORDER — MOUNJARO 2.5 MG/0.5ML ~~LOC~~ SOAJ
SUBCUTANEOUS | 0 refills | Status: DC
Start: 2021-09-08 — End: 2022-06-02
  Filled 2021-09-08: qty 2, 28d supply, fill #0

## 2021-09-24 ENCOUNTER — Other Ambulatory Visit: Payer: Self-pay

## 2021-09-24 DIAGNOSIS — I10 Essential (primary) hypertension: Secondary | ICD-10-CM | POA: Diagnosis not present

## 2021-09-24 DIAGNOSIS — R632 Polyphagia: Secondary | ICD-10-CM | POA: Diagnosis not present

## 2021-09-24 DIAGNOSIS — E118 Type 2 diabetes mellitus with unspecified complications: Secondary | ICD-10-CM | POA: Diagnosis not present

## 2021-09-24 MED ORDER — MOUNJARO 7.5 MG/0.5ML ~~LOC~~ SOAJ
SUBCUTANEOUS | 0 refills | Status: DC
  Filled 2021-10-30: qty 2, 28d supply, fill #0

## 2021-09-24 MED ORDER — MOUNJARO 5 MG/0.5ML ~~LOC~~ SOAJ
SUBCUTANEOUS | 0 refills | Status: DC
  Filled 2021-09-24: qty 2, 28d supply, fill #0

## 2021-10-07 ENCOUNTER — Encounter (INDEPENDENT_AMBULATORY_CARE_PROVIDER_SITE_OTHER): Payer: Self-pay

## 2021-10-22 DIAGNOSIS — I10 Essential (primary) hypertension: Secondary | ICD-10-CM | POA: Diagnosis not present

## 2021-10-22 DIAGNOSIS — R632 Polyphagia: Secondary | ICD-10-CM | POA: Diagnosis not present

## 2021-10-22 DIAGNOSIS — Z9189 Other specified personal risk factors, not elsewhere classified: Secondary | ICD-10-CM | POA: Diagnosis not present

## 2021-10-22 DIAGNOSIS — E118 Type 2 diabetes mellitus with unspecified complications: Secondary | ICD-10-CM | POA: Diagnosis not present

## 2021-10-30 ENCOUNTER — Other Ambulatory Visit: Payer: Self-pay

## 2022-01-14 ENCOUNTER — Encounter: Payer: BC Managed Care – PPO | Admitting: Family Medicine

## 2022-02-26 DIAGNOSIS — K648 Other hemorrhoids: Secondary | ICD-10-CM | POA: Diagnosis not present

## 2022-02-26 DIAGNOSIS — Z1211 Encounter for screening for malignant neoplasm of colon: Secondary | ICD-10-CM | POA: Diagnosis not present

## 2022-03-29 DIAGNOSIS — R632 Polyphagia: Secondary | ICD-10-CM | POA: Diagnosis not present

## 2022-03-29 DIAGNOSIS — E1169 Type 2 diabetes mellitus with other specified complication: Secondary | ICD-10-CM | POA: Diagnosis not present

## 2022-03-29 DIAGNOSIS — K7581 Nonalcoholic steatohepatitis (NASH): Secondary | ICD-10-CM | POA: Diagnosis not present

## 2022-05-13 DIAGNOSIS — R632 Polyphagia: Secondary | ICD-10-CM | POA: Diagnosis not present

## 2022-05-13 DIAGNOSIS — E65 Localized adiposity: Secondary | ICD-10-CM | POA: Diagnosis not present

## 2022-05-13 DIAGNOSIS — E1169 Type 2 diabetes mellitus with other specified complication: Secondary | ICD-10-CM | POA: Diagnosis not present

## 2022-06-01 ENCOUNTER — Emergency Department: Payer: BC Managed Care – PPO

## 2022-06-01 ENCOUNTER — Encounter
Admission: EM | Disposition: A | Discharge status: Home / Self Care | Payer: Self-pay | Source: Home / Self Care | Attending: Emergency Medicine

## 2022-06-01 ENCOUNTER — Other Ambulatory Visit: Payer: Self-pay

## 2022-06-01 ENCOUNTER — Observation Stay
Admission: EM | Admit: 2022-06-01 | Discharge: 2022-06-02 | Disposition: A | Discharge status: Home / Self Care | Payer: BC Managed Care – PPO | Attending: Surgery | Admitting: Surgery

## 2022-06-01 DIAGNOSIS — K8012 Calculus of gallbladder with acute and chronic cholecystitis without obstruction: Principal | ICD-10-CM | POA: Insufficient documentation

## 2022-06-01 DIAGNOSIS — Z79899 Other long term (current) drug therapy: Secondary | ICD-10-CM | POA: Insufficient documentation

## 2022-06-01 DIAGNOSIS — R935 Abnormal findings on diagnostic imaging of other abdominal regions, including retroperitoneum: Secondary | ICD-10-CM | POA: Diagnosis not present

## 2022-06-01 DIAGNOSIS — K760 Fatty (change of) liver, not elsewhere classified: Secondary | ICD-10-CM | POA: Diagnosis not present

## 2022-06-01 DIAGNOSIS — K810 Acute cholecystitis: Secondary | ICD-10-CM | POA: Diagnosis not present

## 2022-06-01 DIAGNOSIS — K8021 Calculus of gallbladder without cholecystitis with obstruction: Secondary | ICD-10-CM

## 2022-06-01 DIAGNOSIS — R109 Unspecified abdominal pain: Secondary | ICD-10-CM | POA: Diagnosis not present

## 2022-06-01 DIAGNOSIS — K828 Other specified diseases of gallbladder: Secondary | ICD-10-CM | POA: Diagnosis not present

## 2022-06-01 DIAGNOSIS — Z8616 Personal history of COVID-19: Secondary | ICD-10-CM | POA: Insufficient documentation

## 2022-06-01 DIAGNOSIS — R079 Chest pain, unspecified: Secondary | ICD-10-CM | POA: Diagnosis not present

## 2022-06-01 DIAGNOSIS — I10 Essential (primary) hypertension: Secondary | ICD-10-CM | POA: Insufficient documentation

## 2022-06-01 DIAGNOSIS — K819 Cholecystitis, unspecified: Secondary | ICD-10-CM | POA: Diagnosis present

## 2022-06-01 DIAGNOSIS — R9431 Abnormal electrocardiogram [ECG] [EKG]: Secondary | ICD-10-CM | POA: Diagnosis not present

## 2022-06-01 DIAGNOSIS — R1013 Epigastric pain: Secondary | ICD-10-CM

## 2022-06-01 DIAGNOSIS — K8020 Calculus of gallbladder without cholecystitis without obstruction: Secondary | ICD-10-CM | POA: Diagnosis not present

## 2022-06-01 HISTORY — DX: Acute cholecystitis: K81.0

## 2022-06-01 LAB — CBC
HCT: 43 % (ref 39.0–52.0)
Hemoglobin: 14.1 g/dL (ref 13.0–17.0)
MCH: 28.5 pg (ref 26.0–34.0)
MCHC: 32.8 g/dL (ref 30.0–36.0)
MCV: 87 fL (ref 80.0–100.0)
Platelets: 245 10*3/uL (ref 150–400)
RBC: 4.94 MIL/uL (ref 4.22–5.81)
RDW: 12.8 % (ref 11.5–15.5)
WBC: 8.6 10*3/uL (ref 4.0–10.5)
nRBC: 0 % (ref 0.0–0.2)

## 2022-06-01 LAB — BASIC METABOLIC PANEL
Anion gap: 9 (ref 5–15)
BUN: 15 mg/dL (ref 6–20)
CO2: 28 mmol/L (ref 22–32)
Calcium: 9.2 mg/dL (ref 8.9–10.3)
Chloride: 102 mmol/L (ref 98–111)
Creatinine, Ser: 0.96 mg/dL (ref 0.61–1.24)
GFR, Estimated: >60 mL/min (ref >60)
Glucose, Bld: 117 mg/dL — ABNORMAL HIGH (ref 70–99)
Potassium: 4 mmol/L (ref 3.5–5.1)
Sodium: 139 mmol/L (ref 135–145)

## 2022-06-01 LAB — HEPATIC FUNCTION PANEL
ALT: 52 U/L — ABNORMAL HIGH (ref 0–44)
AST: 101 U/L — ABNORMAL HIGH (ref 15–41)
Albumin: 3.8 g/dL (ref 3.5–5.0)
Alkaline Phosphatase: 93 U/L (ref 38–126)
Bilirubin, Direct: 0.7 mg/dL — ABNORMAL HIGH (ref 0.0–0.2)
Indirect Bilirubin: 1.1 mg/dL — ABNORMAL HIGH (ref 0.3–0.9)
Total Bilirubin: 1.8 mg/dL — ABNORMAL HIGH (ref 0.3–1.2)
Total Protein: 7.5 g/dL (ref 6.5–8.1)

## 2022-06-01 LAB — TROPONIN I (HIGH SENSITIVITY)
Troponin I (High Sensitivity): 3 ng/L (ref <18)
Troponin I (High Sensitivity): 3 ng/L (ref <18)

## 2022-06-01 LAB — LIPASE, BLOOD: Lipase: 41 U/L (ref 11–51)

## 2022-06-01 SURGERY — CHOLECYSTECTOMY, ROBOT-ASSISTED, LAPAROSCOPIC
Anesthesia: General

## 2022-06-01 MED ORDER — IOHEXOL 300 MG/ML  SOLN
100.0000 mL | Freq: Once | INTRAMUSCULAR | Status: AC | PRN
  Administered 2022-06-01: 100 mL via INTRAVENOUS

## 2022-06-01 MED ORDER — PROPOFOL 10 MG/ML IV BOLUS
INTRAVENOUS | Status: AC
  Filled 2022-06-01: qty 20

## 2022-06-01 MED ORDER — MIDAZOLAM HCL 2 MG/2ML IJ SOLN
2.0000 mg | Freq: Once | INTRAMUSCULAR | Status: AC
  Administered 2022-06-01: 2 mg via INTRAVENOUS
  Filled 2022-06-01: qty 2

## 2022-06-01 MED ORDER — SODIUM CHLORIDE (PF) 0.9 % IJ SOLN
INTRAMUSCULAR | Status: AC
  Filled 2022-06-01: qty 50

## 2022-06-01 MED ORDER — HYDROMORPHONE HCL 1 MG/ML IJ SOLN
0.5000 mg | Freq: Once | INTRAMUSCULAR | Status: AC | PRN
  Administered 2022-06-01: 0.5 mg via INTRAVENOUS
  Filled 2022-06-01: qty 0.5

## 2022-06-01 MED ORDER — PIPERACILLIN-TAZOBACTAM 3.375 G IVPB 30 MIN
3.3750 g | Freq: Once | INTRAVENOUS | Status: AC
  Administered 2022-06-01: 3.375 g via INTRAVENOUS
  Filled 2022-06-01: qty 50

## 2022-06-01 MED ORDER — DEXAMETHASONE SODIUM PHOSPHATE 10 MG/ML IJ SOLN
INTRAMUSCULAR | Status: AC
  Filled 2022-06-01: qty 1

## 2022-06-01 MED ORDER — PIPERACILLIN-TAZOBACTAM 3.375 G IVPB 30 MIN: 3.3750 g | Freq: Three times a day (TID) | INTRAVENOUS | Status: DC

## 2022-06-01 MED ORDER — ONDANSETRON HCL 4 MG/2ML IJ SOLN
4.0000 mg | Freq: Once | INTRAMUSCULAR | Status: AC
  Administered 2022-06-01: 4 mg via INTRAVENOUS
  Filled 2022-06-01: qty 2

## 2022-06-01 MED ORDER — FENTANYL CITRATE PF 50 MCG/ML IJ SOSY
50.0000 ug | PREFILLED_SYRINGE | Freq: Once | INTRAMUSCULAR | Status: AC
  Administered 2022-06-01: 50 ug via INTRAVENOUS
  Filled 2022-06-01: qty 1

## 2022-06-01 MED ORDER — BUPIVACAINE HCL (PF) 0.25 % IJ SOLN
INTRAMUSCULAR | Status: AC
  Filled 2022-06-01: qty 30

## 2022-06-01 MED ORDER — MIDAZOLAM HCL 2 MG/2ML IJ SOLN
INTRAMUSCULAR | Status: AC
  Filled 2022-06-01: qty 2

## 2022-06-01 MED ORDER — HYDROMORPHONE HCL 1 MG/ML IJ SOLN
0.5000 mg | Freq: Once | INTRAMUSCULAR | Status: AC
  Administered 2022-06-01: 0.5 mg via INTRAVENOUS
  Filled 2022-06-01: qty 0.5

## 2022-06-01 MED ORDER — ONDANSETRON HCL 4 MG/2ML IJ SOLN
INTRAMUSCULAR | Status: AC
  Filled 2022-06-01: qty 2

## 2022-06-01 MED ORDER — EPINEPHRINE PF 1 MG/ML IJ SOLN
INTRAMUSCULAR | Status: AC
  Filled 2022-06-01: qty 1

## 2022-06-01 MED ORDER — PIPERACILLIN-TAZOBACTAM 3.375 G IVPB: 3.3750 g | Freq: Three times a day (TID) | INTRAVENOUS | Status: DC

## 2022-06-01 MED ORDER — LORAZEPAM 1 MG PO TABS
1.0000 mg | ORAL_TABLET | Freq: Once | ORAL | Status: AC
  Administered 2022-06-01: 1 mg via ORAL
  Filled 2022-06-01: qty 1

## 2022-06-01 MED ORDER — BUPIVACAINE LIPOSOME 1.3 % IJ SUSP
INTRAMUSCULAR | Status: AC
  Filled 2022-06-01: qty 20

## 2022-06-01 MED ORDER — INDOCYANINE GREEN 25 MG IV SOLR
2.5000 mg | Freq: Once | INTRAVENOUS | Status: DC
  Filled 2022-06-01: qty 10

## 2022-06-01 MED ORDER — GLYCOPYRROLATE 0.2 MG/ML IJ SOLN
INTRAMUSCULAR | Status: AC
  Filled 2022-06-01: qty 1

## 2022-06-01 MED ORDER — FENTANYL CITRATE (PF) 100 MCG/2ML IJ SOLN
INTRAMUSCULAR | Status: AC
  Filled 2022-06-01: qty 2

## 2022-06-01 MED ORDER — SODIUM CHLORIDE 0.9 % IV BOLUS
1000.0000 mL | Freq: Once | INTRAVENOUS | Status: AC
  Administered 2022-06-01: 1000 mL via INTRAVENOUS

## 2022-06-01 MED ORDER — GADOBUTROL 1 MMOL/ML IV SOLN
10.0000 mL | Freq: Once | INTRAVENOUS | Status: AC | PRN
  Administered 2022-06-01: 10 mL via INTRAVENOUS

## 2022-06-01 MED ORDER — CHLORHEXIDINE GLUCONATE CLOTH 2 % EX PADS
6.0000 | MEDICATED_PAD | Freq: Every day | CUTANEOUS | Status: DC
  Administered 2022-06-02: 6 via TOPICAL
  Filled 2022-06-01: qty 6

## 2022-06-01 SURGICAL SUPPLY — 49 items
CANNULA REDUC XI 12-8 STAPL (CANNULA) ×1
CANNULA REDUCER 12-8 DVNC XI (CANNULA) ×1 IMPLANT
CATH REDDICK CHOLANGI 4FR 50CM (CATHETERS) IMPLANT
CLIP LIGATING HEMO O LOK GREEN (MISCELLANEOUS) ×1 IMPLANT
COVER LIGHT HANDLE STERIS (MISCELLANEOUS) IMPLANT
DERMABOND ADVANCED .7 DNX12 (GAUZE/BANDAGES/DRESSINGS) ×1 IMPLANT
DRAPE ARM DVNC X/XI (DISPOSABLE) ×4 IMPLANT
DRAPE COLUMN DVNC XI (DISPOSABLE) ×1 IMPLANT
DRAPE DA VINCI XI ARM (DISPOSABLE) ×4
DRAPE DA VINCI XI COLUMN (DISPOSABLE) ×1
ELECT CAUTERY BLADE 6.4 (BLADE) ×1 IMPLANT
ELECT REM PT RETURN 9FT ADLT (ELECTROSURGICAL) ×1
ELECTRODE REM PT RTRN 9FT ADLT (ELECTROSURGICAL) ×1 IMPLANT
GLOVE BIO SURGEON STRL SZ7 (GLOVE) ×2 IMPLANT
GOWN STRL REUS W/ TWL LRG LVL3 (GOWN DISPOSABLE) ×4 IMPLANT
GOWN STRL REUS W/TWL LRG LVL3 (GOWN DISPOSABLE) ×3
IRRIGATION STRYKERFLOW (MISCELLANEOUS) IMPLANT
IRRIGATOR STRYKERFLOW (MISCELLANEOUS)
IV CATH ANGIO 12GX3 LT BLUE (NEEDLE) IMPLANT
KIT PINK PAD W/HEAD ARE REST (MISCELLANEOUS) ×1 IMPLANT
KIT PINK PAD W/HEAD ARM REST (MISCELLANEOUS) ×1 IMPLANT
LABEL OR SOLS (LABEL) ×1 IMPLANT
MANIFOLD NEPTUNE II (INSTRUMENTS) ×1 IMPLANT
NDL HYPO 22X1.5 SAFETY MO (MISCELLANEOUS) ×1 IMPLANT
NEEDLE HYPO 22X1.5 SAFETY MO (MISCELLANEOUS) ×1 IMPLANT
NS IRRIG 500ML POUR BTL (IV SOLUTION) ×1 IMPLANT
OBTURATOR OPTICAL STANDARD 8MM (TROCAR) ×1
OBTURATOR OPTICAL STND 8 DVNC (TROCAR) ×1
OBTURATOR OPTICALSTD 8 DVNC (TROCAR) ×1 IMPLANT
PACK LAP CHOLECYSTECTOMY (MISCELLANEOUS) ×1 IMPLANT
PENCIL SMOKE EVACUATOR (MISCELLANEOUS) ×1 IMPLANT
SEAL UNIV 5-12 XI (MISCELLANEOUS) ×3 IMPLANT
SEAL XI UNIVERSAL 5-12 (MISCELLANEOUS) ×3
SET TUBE SMOKE EVAC HIGH FLOW (TUBING) ×1 IMPLANT
SOL ELECTROSURG ANTI STICK (MISCELLANEOUS) ×1
SOLUTION ELECTROSURG ANTI STCK (MISCELLANEOUS) ×1 IMPLANT
SPIKE FLUID TRANSFER (MISCELLANEOUS) ×1 IMPLANT
SPONGE T-LAP 18X18 ~~LOC~~+RFID (SPONGE) ×1 IMPLANT
SPONGE T-LAP 4X18 ~~LOC~~+RFID (SPONGE) IMPLANT
STOPCOCK 3 WAY MALE LL (IV SETS)
STOPCOCK 3WAY MALE LL (IV SETS) IMPLANT
SUT MNCRL AB 4-0 PS2 18 (SUTURE) ×1 IMPLANT
SUT VICRYL 0 UR6 27IN ABS (SUTURE) ×2 IMPLANT
SYR 30ML LL (SYRINGE) ×1 IMPLANT
SYS BAG RETRIEVAL 10MM (BASKET) ×1
SYSTEM BAG RETRIEVAL 10MM (BASKET) ×1 IMPLANT
TRAP FLUID SMOKE EVACUATOR (MISCELLANEOUS) ×1 IMPLANT
WATER STERILE IRR 3000ML UROMA (IV SOLUTION) IMPLANT
WATER STERILE IRR 500ML POUR (IV SOLUTION) ×1 IMPLANT

## 2022-06-01 NOTE — H&P (Signed)
Patient ID: Francisco Hamilton, male   DOB: 04-24-1975, 47 y.o.   MRN: [id number]  HPI Francisco Hamilton is a 47 y.o. male seen in consultation at the request of Dr. Cherylann Banas case d/w him in detail.  He presents with an acute onset of abdominal pain in the epigastric area that woke him from his sleep.  Pain was severe lasted at least 6 hours or so.  And he felt like he was hit with a baseball bat.  Patient will also located in the epigastric area. No fam hx of biliary disease.  EKG personally revealed no evidence of ischemia and normal sinus rhythm.  Ultrasound as well as MRI and CT scan personally reviewed showing evidence of gallstones with edema distended gallbladder. No evidence of choledocholithiasis, did have increasing LFTs to include a total bilirubin of 1.8 and increase LFTs. He denies any fevers any chills.  He is able to perform more than 4 METS of activity without any shortness of breath or chest pain.  No prior abdominal operations   HPI  Past Medical History:  Diagnosis Date   Allergy    COVID-19    06/2021 cruise   History of chicken pox    Hypertension    tx'ed in 20s off meds since     Past Surgical History:  Procedure Laterality Date   left knee arthroscopy     high school menscus issue    Family History  Problem Relation Age of Onset   Diabetes Father    Heart disease Sister        valvular heart disease   Diabetes Paternal Grandmother    Hypertension Paternal Grandmother    Heart disease Paternal Grandmother        valvular heart disease died in late 36s    Multiple myeloma Paternal Grandmother    Diabetes Paternal Grandfather    Hypertension Paternal Grandfather     Social History Social History   Tobacco Use   Smoking status: Never   Smokeless tobacco: Never  Substance Use Topics   Alcohol use: Yes   Drug use: Not Currently    Allergies  Allergen Reactions   Nsaids     Hives     No current facility-administered medications for  this encounter.   Current Outpatient Medications  Medication Sig Dispense Refill   acetaminophen (TYLENOL) 500 MG tablet Take 1,000 mg by mouth daily as needed.     Cholecalciferol 1.25 MG (50000 UT) capsule Take 1 capsule (50,000 Units total) by mouth once a week. d3 13 capsule 0   cyclobenzaprine (FLEXERIL) 5 MG tablet Take 1 tablet (5 mg total) by mouth at bedtime as needed for muscle spasms. 30 tablet 5   gabapentin (NEURONTIN) 300 MG capsule Take by mouth.     ipratropium (ATROVENT) 0.06 % nasal spray Place 2 sprays into both nostrils 3 (three) times daily.     meloxicam (MOBIC) 15 MG tablet Take 1 tablet (15 mg total) by mouth daily. (Patient not taking: Reported on 08/14/2021) 30 tablet 1   olmesartan-hydrochlorothiazide (BENICAR HCT) 20-12.5 MG tablet TAKE 1 TABLET BY MOUTH DAILY IN THE MORNING. IF BLOOD PRESSURE NOT AT GOAL< 130,<80 AFTER 2 WEEKS, INCREASE TO 2 TABLETS DAILY IN THE MORNING 90 tablet 3   phentermine (ADIPEX-P) 37.5 MG tablet Take 37.5 mg by mouth daily before breakfast. Dr. Juleen China     tirzepatide [facility]) 2.5 MG/0.5ML Pen Inject 2.5mg  under the skin once weekly. 90 days 6 mL 0  tirzepatide [facility]) 5 MG/0.5ML Pen Inject 5 mg under the skin once weekly. 90 days 6 mL 0   tirzepatide (MOUNJARO) 7.5 MG/0.5ML Pen 7.5 mg Subcutaneous Once a week 90 days 6 mL 0     Review of Systems Full ROS  was asked and was negative except for the information on the HPI  Physical Exam Blood pressure 134/62, pulse 77, temperature 98.1 F (36.7 C), temperature source Oral, resp. rate 12, height 6' (1.829 m), weight (!) 140.6 kg, SpO2 100 %. CONSTITUTIONAL: NAD EYES: Pupils are equal, round, , Sclera are non-icteric. EARS, NOSE, MOUTH AND THROAT: The oropharynx is clear. The oral mucosa is pink and moist. Hearing is intact to voice. LYMPH NODES:  Lymph nodes in the neck are normal. RESPIRATORY:  Lungs are clear. There is normal respiratory effort, with equal breath sounds  bilaterally, and without pathologic use of accessory muscles. CARDIOVASCULAR: Heart is regular without murmurs, gallops, or rubs. GI: The abdomen is  soft, mild tenderness RUQ and epigastric area w/o peritonitis.  There are no palpable masses. There is no hepatosplenomegaly. There are normal bowel sounds  GU: Rectal deferred.   MUSCULOSKELETAL: Normal muscle strength and tone. No cyanosis or edema.   SKIN: Turgor is good and there are no pathologic skin lesions or ulcers. NEUROLOGIC: Motor and sensation is grossly normal. Cranial nerves are grossly intact. PSYCH:  Oriented to person, place and time. Affect is normal.  Data Reviewed  I have personally reviewed the patient's imaging, laboratory findings and medical records.    Assessment/Plan 47 year old male with acute cholecystitis.  No evidence of cholangitis no evidence of common bile duct stones or biliary obstruction.  Discussed with patient in detail about his disease process.  Provide did pass a stone.  Discussed with him in detail that usually treatment for acute cholecystitis is prompt cholecystectomy.  Will start broad-spectrum antibiotics, fluid bolus with saline and perform prompt cholecystectomy as OR availability allows.The risks, benefits, complications, treatment options, and expected outcomes were discussed with the patient. The possibilities of bleeding, recurrent infection, finding a normal gallbladder, perforation of viscus organs, damage to surrounding structures, bile leak, abscess formation, needing a drain placed, the need for additional procedures, reaction to medication, pulmonary aspiration,  failure to diagnose a condition, the possible need to convert to an open procedure, and creating a complication requiring transfusion or operation were discussed with the patient. The patient and/or family concurred with the proposed plan, giving informed consent.   Caroleen Hamman, MD FACS General Surgeon 06/01/2022, 7:48 PM

## 2022-06-01 NOTE — ED Notes (Signed)
ED Provider at bedside. 

## 2022-06-01 NOTE — ED Notes (Signed)
Patient unable to lay flat for MRI. Patient brought back to room. Jari Pigg, MD aware.

## 2022-06-01 NOTE — ED Provider Notes (Signed)
[facility] Provider Note    Event Date/Time   First MD Initiated Contact with Patient 06/01/22 [phone]     (approximate)   History   Abdominal Pain   HPI  Francisco Hamilton is a 47 y.o. male who presents to the ED for evaluation of Abdominal Pain   I review a PCP visit from June of last year.  Morbidly obese patient with history of HTN  Patient presents to the ED for evaluation of epigastric pain that awoke him from sleep.  Reports being at his baseline when he went to bed but awoke overnight around 3 AM with severe epigastric/lower chest pain.  No coexisting symptoms such as nausea, dyspnea, cough, emesis   Physical Exam   Triage Vital Signs: ED Triage Vitals  Enc Vitals Group     BP 06/01/22 0443 (!) 142/86     Pulse Rate 06/01/22 0443 80     Resp 06/01/22 0443 (!) 22     Temp 06/01/22 0443 98 F (36.7 C)     Temp Source 06/01/22 0443 Oral     SpO2 06/01/22 0443 100 %     Weight 06/01/22 0442 (!) 310 lb (140.6 kg)     Height 06/01/22 0442 6' (1.829 m)     Head Circumference --      Peak Flow --      Pain Score 06/01/22 0441 9     Pain Loc --      Pain Edu? --      Excl. in [place]? --     Most recent vital signs: Vitals:   06/01/22 0443  BP: (!) 142/86  Pulse: 80  Resp: (!) 22  Temp: 98 F (36.7 C)  SpO2: 100%    General: Awake, no distress.  CV:  Good peripheral perfusion.  Resp:  Normal effort.  Abd:  No distention.  Epigastric and RUQ tenderness without peritoneal features, lower abdomen is benign. MSK:  No deformity noted.  Neuro:  No focal deficits appreciated. Other:     ED Results / Procedures / Treatments   Labs (all labs ordered are listed, but only abnormal results are displayed) Labs Reviewed  BASIC METABOLIC PANEL - Abnormal; Notable for the following components:      Result Value   Glucose, Bld 117 (*)    All other components within normal limits  HEPATIC FUNCTION PANEL - Abnormal; Notable for the  following components:   AST 101 (*)    ALT 52 (*)    Total Bilirubin 1.8 (*)    Bilirubin, Direct 0.7 (*)    Indirect Bilirubin 1.1 (*)    All other components within normal limits  CBC  LIPASE, BLOOD  TROPONIN I (HIGH SENSITIVITY)  TROPONIN I (HIGH SENSITIVITY)    EKG Sinus rhythm with a rate of 82 bpm.  Normal axis and intervals.  No for signs of acute ischemia.  Wandering baseline  RADIOLOGY CXR interpreted by me without evidence of acute cardiopulmonary pathology. RUQ ultrasound interpreted by me with cholelithiasis without cholecystitis  Official radiology report(s): US ABDOMEN LIMITED RUQ (LIVER/GB)  Result Date: 06/01/2022 CLINICAL DATA:  Right upper quadrant pain this morning EXAM: ULTRASOUND ABDOMEN LIMITED RIGHT UPPER QUADRANT COMPARISON:  None Available. FINDINGS: Gallbladder: Small shadowing gallstones. Full gallbladder but no gallbladder wall thickening or pericholecystic edema. Common bile duct: Diameter: 4 mm Liver: Although no diminished acoustic penetration, convincingly echogenic liver with pericholecystic sparing. Negative for mass. Portal vein is patent on color Doppler  imaging with normal direction of blood flow towards the liver. IMPRESSION: 1. Cholelithiasis without features of acute cholecystitis. 2. Hepatic steatosis. Electronically Signed   By: Jorje Guild M.D.   On: 06/01/2022 06:12   DG Chest Port 1 View  Result Date: 06/01/2022 CLINICAL DATA:  Chest pain EXAM: PORTABLE CHEST 1 VIEW COMPARISON:  None Available. FINDINGS: Low volume chest with asymmetric right diaphragm elevation. There is no edema, consolidation, effusion, or pneumothorax. Normal heart size and mediastinal contours. Artifact from EKG leads. IMPRESSION: Negative portable chest. Electronically Signed   By: Jorje Guild M.D.   On: 06/01/2022 05:31    PROCEDURES and INTERVENTIONS:  .1-3 Lead EKG Interpretation  Performed by: Vladimir Crofts, MD Authorized by: Vladimir Crofts, MD      Interpretation: normal     ECG rate:  78   ECG rate assessment: normal     Rhythm: sinus rhythm     Ectopy: none     Conduction: normal     Medications  midazolam (VERSED) injection 2 mg (has no administration in time range)  LORazepam (ATIVAN) tablet 1 mg (has no administration in time range)  fentaNYL (SUBLIMAZE) injection 50 mcg (50 mcg Intravenous Given 06/01/22 0508)     IMPRESSION / MDM / ASSESSMENT AND PLAN / ED COURSE  I reviewed the triage vital signs and the nursing notes.  Differential diagnosis includes, but is not limited to, cholelithiasis, cholecystitis, pancreatitis, ACS, pneumothorax  {Patient presents with symptoms of an acute illness or injury that is potentially life-threatening.  Obese 47 year old male presents to the ED with epigastric pain consistent with cholelithiasis and possibly choledocholithiasis.  Cardiac workup so far reassuring with a nonischemic EKG and negative troponin, we will trend with a second set of troponins as well.  He has a normal CBC without leukocytosis and basic metabolic panel.  Lipase is normal but bilirubin and AST/ALT are elevated.  RUQ ultrasound with gallstones.  Alongside his persistent pain I am concerned about choledocholithiasis.  He is quite anxious about MRCP and is willing to try.  We will provide anxiolytics and attempt.  Signed out to oncoming provider  Clinical Course as of 06/01/22 0645  Tue Jun 01, 2022  0549 Reassessed during u/s [DS]  0625 Reassessed and discussed gallstones and my concerns for possibly choledocholithiasis.  Continues to have some pain and tenderness on exam.  He tells me he is quite claustrophobic and initially refuses MRI but with reassurance and explaining the reasoning of why it is an important study in the situation, he is willing to give it a shot with benzodiazepines [DS]    Clinical Course User Index [DS] Vladimir Crofts, MD     FINAL CLINICAL IMPRESSION(S) / ED DIAGNOSES   Final diagnoses:   Calculus of gallbladder with biliary obstruction but without cholecystitis  Epigastric pain     Rx / DC Orders   ED Discharge Orders     None        Note:  This document was prepared using Dragon voice recognition software and may include unintentional dictation errors.   Vladimir Crofts, MD 06/01/22 [phone]

## 2022-06-01 NOTE — ED Notes (Signed)
Patient to MRI at this time.

## 2022-06-01 NOTE — ED Triage Notes (Signed)
Central cp described as pressure that woke pt from sleep. Denies hx of same. Denies radiation to other areas. Pt alert and oriented following commands. Breathing unlabored speaking in full sentences.

## 2022-06-01 NOTE — ED Notes (Signed)
Patient taken to MRI at this time 

## 2022-06-01 NOTE — ED Notes (Signed)
Patient updated on plan of care. No needs expressed at this time. Nad noted. Family at bedside.

## 2022-06-01 NOTE — Anesthesia Preprocedure Evaluation (Signed)
Anesthesia Evaluation  Patient identified by MRN, date of birth, ID band Patient awake    Reviewed: Allergy & Precautions, NPO status , Patient's Chart, lab work & pertinent test results  History of Anesthesia Complications Negative for: history of anesthetic complications  Airway Mallampati: I   Neck ROM: Full    Dental  (+) Chipped   Pulmonary neg pulmonary ROS   Pulmonary exam normal breath sounds clear to auscultation       Cardiovascular Exercise Tolerance: Good hypertension, Normal cardiovascular exam Rhythm:Regular Rate:Normal  ECG 06/01/22: Sinus rhythm Low voltage, precordial leads   Neuro/Psych negative neurological ROS     GI/Hepatic negative GI ROS,,,  Endo/Other  Class 3 obesity  Renal/GU negative Renal ROS     Musculoskeletal   Abdominal   Peds  Hematology negative hematology ROS (+)   Anesthesia Other Findings Last dose of Mounjaro 05/30/22  Reproductive/Obstetrics                             Anesthesia Physical Anesthesia Plan  ASA: 3 and emergent  Anesthesia Plan: General   Post-op Pain Management:    Induction: Intravenous and Rapid sequence  PONV Risk Score and Plan: 2 and Ondansetron, Dexamethasone and Treatment may vary due to age or medical condition  Airway Management Planned: Oral ETT  Additional Equipment:   Intra-op Plan:   Post-operative Plan: Extubation in OR  Informed Consent: I have reviewed the patients History and Physical, chart, labs and discussed the procedure including the risks, benefits and alternatives for the proposed anesthesia with the patient or authorized representative who has indicated his/her understanding and acceptance.     Dental advisory given  Plan Discussed with: CRNA  Anesthesia Plan Comments: (Patient consented for risks of anesthesia including but not limited to:  - adverse reactions to medications - damage to  eyes, teeth, lips or other oral mucosa - nerve damage due to positioning  - sore throat or hoarseness - damage to heart, brain, nerves, lungs, other parts of body or loss of life  Informed patient about role of CRNA in peri- and intra-operative care.  Patient voiced understanding.)        Anesthesia Quick Evaluation

## 2022-06-01 NOTE — ED Notes (Signed)
Per Phamacy, waiting on OR time to send IC-Green

## 2022-06-01 NOTE — ED Provider Notes (Signed)
7:51 AM Assumed care for off going team.   Blood pressure (!) 144/90, pulse 85, temperature 98 F (36.7 C), temperature source Oral, resp. rate 20, height 6' (1.829 m), weight (!) 140.6 kg, SpO2 100 %.  See their HPI for full report but in brief pending MRCP  Patient was unable to tolerate MRCP even after IV Versed he is reporting severe pain.  His pain is in his upper abdomen.  Discussed with patient I cannot give any pain medications at the same time as the IV for said as this would be a sedation.  However we will give patient some IV Dilaudid, make sure he is set up to pulse ox and try to get a CT scan to rule out any other acute pathology given he reports severe pain  CT imaging was normal  10:07 AM discussed with Dr. Vicente Males and fortunately Dr. Allen Norris will not be here tomorrow so they do want to proceed with getting an MRCP prior to admission in case patient would need to be transferred.  Dr. Vicente Males did feel like patient still needed MRCP.  He did not want to have patient do elective ERCP due to the risk for procedure and stressed the importance of MRCP prior to admission.  Patient pain remains improved but given how significant was earlier we discussed outpatient follow-up with surgery versus potentially getting it done inpatient.  He would like to wait and discuss with surgical team.  Unfortunately surgery is currently in surgery. Dr Dahlia Byes will come see after surgery.          Vanessa Rustburg, MD 06/01/22 [phone]

## 2022-06-01 NOTE — ED Notes (Signed)
Pt denies any abd pain at this time

## 2022-06-01 NOTE — ED Notes (Signed)
Patient at CT scan.

## 2022-06-01 NOTE — ED Provider Notes (Signed)
-----------------------------------------   3:41 PM on 06/01/2022 -----------------------------------------  I took over care of this patient from Dr. Jari Pigg.  MRCP does not show evidence of biliary obstruction.  Lab workup is reassuring.  The patient's pain is well-controlled at this time.  Plan will be surgery consultation and reassessment although the patient at this time has no clinical evidence of acute cholecystitis and will be appropriate for discharge if his pain is well-controlled.  ----------------------------------------- 7:35 PM on 06/01/2022 -----------------------------------------  The patient has been evaluated by Dr. Dahlia Byes who I consulted and discussed the case with; he recommends admission to the [facility] for surgery tonight.   Arta Silence, MD 06/01/22 Joen Laura

## 2022-06-02 ENCOUNTER — Emergency Department: Payer: BC Managed Care – PPO | Admitting: Anesthesiology

## 2022-06-02 ENCOUNTER — Other Ambulatory Visit: Payer: Self-pay

## 2022-06-02 DIAGNOSIS — K8010 Calculus of gallbladder with chronic cholecystitis without obstruction: Secondary | ICD-10-CM | POA: Diagnosis not present

## 2022-06-02 DIAGNOSIS — K819 Cholecystitis, unspecified: Secondary | ICD-10-CM | POA: Diagnosis present

## 2022-06-02 DIAGNOSIS — K810 Acute cholecystitis: Secondary | ICD-10-CM | POA: Diagnosis not present

## 2022-06-02 LAB — BASIC METABOLIC PANEL
Anion gap: 15 (ref 5–15)
BUN: 11 mg/dL (ref 6–20)
CO2: 18 mmol/L — ABNORMAL LOW (ref 22–32)
Calcium: 8.7 mg/dL — ABNORMAL LOW (ref 8.9–10.3)
Chloride: 105 mmol/L (ref 98–111)
Creatinine, Ser: 0.83 mg/dL (ref 0.61–1.24)
GFR, Estimated: >60 mL/min (ref >60)
Glucose, Bld: 126 mg/dL — ABNORMAL HIGH (ref 70–99)
Potassium: 3.7 mmol/L (ref 3.5–5.1)
Sodium: 138 mmol/L (ref 135–145)

## 2022-06-02 LAB — HIV ANTIBODY (ROUTINE TESTING W REFLEX): HIV Screen 4th Generation wRfx: NONREACTIVE

## 2022-06-02 LAB — CBC
HCT: 46.8 % (ref 39.0–52.0)
Hemoglobin: 15.3 g/dL (ref 13.0–17.0)
MCH: 28.5 pg (ref 26.0–34.0)
MCHC: 32.7 g/dL (ref 30.0–36.0)
MCV: 87.3 fL (ref 80.0–100.0)
Platelets: 211 10*3/uL (ref 150–400)
RBC: 5.36 MIL/uL (ref 4.22–5.81)
RDW: 12.9 % (ref 11.5–15.5)
WBC: 9.8 10*3/uL (ref 4.0–10.5)
nRBC: 0 % (ref 0.0–0.2)

## 2022-06-02 MED ORDER — SUCCINYLCHOLINE CHLORIDE 200 MG/10ML IV SOSY
PREFILLED_SYRINGE | INTRAVENOUS | Status: AC
  Filled 2022-06-02: qty 10

## 2022-06-02 MED ORDER — SUGAMMADEX SODIUM 200 MG/2ML IV SOLN
INTRAVENOUS | Status: DC | PRN
  Administered 2022-06-02: 300 mg via INTRAVENOUS

## 2022-06-02 MED ORDER — OXYCODONE HCL 5 MG/5ML PO SOLN: 5.0000 mg | Freq: Once | ORAL | Status: AC | PRN

## 2022-06-02 MED ORDER — BUPIVACAINE-EPINEPHRINE (PF) 0.25% -1:200000 IJ SOLN
INTRAMUSCULAR | Status: DC | PRN
  Administered 2022-06-02: 30 mL via PERINEURAL

## 2022-06-02 MED ORDER — FENTANYL CITRATE (PF) 100 MCG/2ML IJ SOLN
INTRAMUSCULAR | Status: AC
  Filled 2022-06-02: qty 2

## 2022-06-02 MED ORDER — DIPHENHYDRAMINE HCL 12.5 MG/5ML PO ELIX: 12.5000 mg | ORAL_SOLUTION | Freq: Four times a day (QID) | ORAL | Status: DC | PRN

## 2022-06-02 MED ORDER — LACTATED RINGERS IV SOLN: INTRAVENOUS | Status: DC

## 2022-06-02 MED ORDER — ROCURONIUM BROMIDE 10 MG/ML (PF) SYRINGE
PREFILLED_SYRINGE | INTRAVENOUS | Status: AC
  Filled 2022-06-02: qty 10

## 2022-06-02 MED ORDER — OXYCODONE HCL 5 MG PO TABS
ORAL_TABLET | ORAL | Status: AC
  Filled 2022-06-02: qty 1

## 2022-06-02 MED ORDER — ONDANSETRON HCL 4 MG/2ML IJ SOLN: 4.0000 mg | Freq: Four times a day (QID) | INTRAMUSCULAR | Status: DC | PRN

## 2022-06-02 MED ORDER — MIDAZOLAM HCL 2 MG/2ML IJ SOLN
INTRAMUSCULAR | Status: DC | PRN
  Administered 2022-06-02: 2 mg via INTRAVENOUS

## 2022-06-02 MED ORDER — ACETAMINOPHEN 10 MG/ML IV SOLN: 1000.0000 mg | Freq: Once | INTRAVENOUS | Status: DC | PRN

## 2022-06-02 MED ORDER — PHENYLEPHRINE HCL (PRESSORS) 10 MG/ML IV SOLN
INTRAVENOUS | Status: DC | PRN
  Administered 2022-06-02 (×4): 160 ug via INTRAVENOUS

## 2022-06-02 MED ORDER — PHENYLEPHRINE 80 MCG/ML (10ML) SYRINGE FOR IV PUSH (FOR BLOOD PRESSURE SUPPORT)
PREFILLED_SYRINGE | INTRAVENOUS | Status: AC
  Filled 2022-06-02: qty 10

## 2022-06-02 MED ORDER — MORPHINE SULFATE (PF) 2 MG/ML IV SOLN
INTRAVENOUS | Status: AC
  Filled 2022-06-02: qty 1

## 2022-06-02 MED ORDER — FENTANYL CITRATE (PF) 100 MCG/2ML IJ SOLN
25.0000 ug | INTRAMUSCULAR | Status: DC | PRN
  Administered 2022-06-02: 50 ug via INTRAVENOUS

## 2022-06-02 MED ORDER — ONDANSETRON HCL 4 MG/2ML IJ SOLN: 4.0000 mg | Freq: Once | INTRAMUSCULAR | Status: DC | PRN

## 2022-06-02 MED ORDER — DEXMEDETOMIDINE HCL IN NACL 80 MCG/20ML IV SOLN
INTRAVENOUS | Status: DC | PRN
  Administered 2022-06-02 (×2): 12 ug via INTRAVENOUS

## 2022-06-02 MED ORDER — ORAL CARE MOUTH RINSE: 15.0000 mL | OROMUCOSAL | Status: DC | PRN

## 2022-06-02 MED ORDER — HYDRALAZINE HCL 20 MG/ML IJ SOLN: 10.0000 mg | INTRAMUSCULAR | Status: DC | PRN

## 2022-06-02 MED ORDER — LACTATED RINGERS IV SOLN: INTRAVENOUS | Status: DC | PRN

## 2022-06-02 MED ORDER — OXYCODONE HCL 5 MG PO TABS
5.0000 mg | ORAL_TABLET | Freq: Once | ORAL | Status: AC | PRN
  Administered 2022-06-02: 5 mg via ORAL

## 2022-06-02 MED ORDER — ONDANSETRON 4 MG PO TBDP: 4.0000 mg | ORAL_TABLET | Freq: Four times a day (QID) | ORAL | 0 refills | Status: DC | PRN

## 2022-06-02 MED ORDER — PIPERACILLIN-TAZOBACTAM 3.375 G IVPB
INTRAVENOUS | Status: AC
  Administered 2022-06-02: 3.375 g via INTRAVENOUS
  Filled 2022-06-02: qty 50

## 2022-06-02 MED ORDER — FENTANYL CITRATE PF 50 MCG/ML IJ SOSY
PREFILLED_SYRINGE | INTRAMUSCULAR | Status: AC
  Filled 2022-06-02: qty 1

## 2022-06-02 MED ORDER — ONDANSETRON HCL 4 MG/2ML IJ SOLN
INTRAMUSCULAR | Status: DC | PRN
  Administered 2022-06-02: 4 mg via INTRAVENOUS

## 2022-06-02 MED ORDER — PANTOPRAZOLE SODIUM 40 MG IV SOLR
INTRAVENOUS | Status: AC
  Filled 2022-06-02: qty 10

## 2022-06-02 MED ORDER — ENOXAPARIN SODIUM 80 MG/0.8ML IJ SOSY
0.5000 mg/kg | PREFILLED_SYRINGE | INTRAMUSCULAR | Status: DC
  Administered 2022-06-02: 70 mg via SUBCUTANEOUS
  Filled 2022-06-02: qty 0.7

## 2022-06-02 MED ORDER — ACETAMINOPHEN 500 MG PO TABS
1000.0000 mg | ORAL_TABLET | Freq: Four times a day (QID) | ORAL | Status: DC
  Administered 2022-06-02: 1000 mg via ORAL
  Filled 2022-06-02: qty 2

## 2022-06-02 MED ORDER — ONDANSETRON 4 MG PO TBDP: 4.0000 mg | ORAL_TABLET | Freq: Four times a day (QID) | ORAL | Status: DC | PRN

## 2022-06-02 MED ORDER — MORPHINE SULFATE (PF) 2 MG/ML IV SOLN
2.0000 mg | INTRAVENOUS | Status: DC | PRN
  Administered 2022-06-02: 2 mg via INTRAVENOUS

## 2022-06-02 MED ORDER — PROPOFOL 10 MG/ML IV BOLUS
INTRAVENOUS | Status: DC | PRN
  Administered 2022-06-02: 200 mg via INTRAVENOUS

## 2022-06-02 MED ORDER — ACETAMINOPHEN 10 MG/ML IV SOLN
INTRAVENOUS | Status: AC
  Filled 2022-06-02: qty 100

## 2022-06-02 MED ORDER — PIPERACILLIN-TAZOBACTAM 3.375 G IVPB
3.3750 g | Freq: Three times a day (TID) | INTRAVENOUS | Status: AC
  Administered 2022-06-02: 3.375 g via INTRAVENOUS
  Filled 2022-06-02: qty 50

## 2022-06-02 MED ORDER — AMOXICILLIN-POT CLAVULANATE 875-125 MG PO TABS: 1.0000 | ORAL_TABLET | Freq: Two times a day (BID) | ORAL | 0 refills | Status: AC

## 2022-06-02 MED ORDER — ACETAMINOPHEN 10 MG/ML IV SOLN
INTRAVENOUS | Status: DC | PRN
  Administered 2022-06-02: 1000 mg via INTRAVENOUS

## 2022-06-02 MED ORDER — PHENYLEPHRINE HCL-NACL 20-0.9 MG/250ML-% IV SOLN
INTRAVENOUS | Status: DC | PRN
  Administered 2022-06-02: 20 ug/min via INTRAVENOUS

## 2022-06-02 MED ORDER — ROCURONIUM BROMIDE 100 MG/10ML IV SOLN
INTRAVENOUS | Status: DC | PRN
  Administered 2022-06-02: 45 mg via INTRAVENOUS
  Administered 2022-06-02: 5 mg via INTRAVENOUS

## 2022-06-02 MED ORDER — PANTOPRAZOLE SODIUM 40 MG IV SOLR
40.0000 mg | Freq: Every day | INTRAVENOUS | Status: DC
  Administered 2022-06-02: 40 mg via INTRAVENOUS

## 2022-06-02 MED ORDER — SODIUM CHLORIDE (PF) 0.9 % IJ SOLN
INTRAMUSCULAR | Status: DC | PRN
  Administered 2022-06-02: 50 mL

## 2022-06-02 MED ORDER — PROCHLORPERAZINE EDISYLATE 10 MG/2ML IJ SOLN: 5.0000 mg | Freq: Four times a day (QID) | INTRAMUSCULAR | Status: DC | PRN

## 2022-06-02 MED ORDER — LIDOCAINE HCL (CARDIAC) PF 100 MG/5ML IV SOSY
PREFILLED_SYRINGE | INTRAVENOUS | Status: DC | PRN
  Administered 2022-06-02: 100 mg via INTRAVENOUS

## 2022-06-02 MED ORDER — DEXAMETHASONE SODIUM PHOSPHATE 10 MG/ML IJ SOLN
INTRAMUSCULAR | Status: DC | PRN
  Administered 2022-06-02: 10 mg via INTRAVENOUS

## 2022-06-02 MED ORDER — GLYCOPYRROLATE 0.2 MG/ML IJ SOLN
INTRAMUSCULAR | Status: DC | PRN
  Administered 2022-06-02: .2 mg via INTRAVENOUS

## 2022-06-02 MED ORDER — PROCHLORPERAZINE MALEATE 10 MG PO TABS: 10.0000 mg | ORAL_TABLET | Freq: Four times a day (QID) | ORAL | Status: DC | PRN

## 2022-06-02 MED ORDER — OXYCODONE HCL 5 MG PO TABS
5.0000 mg | ORAL_TABLET | ORAL | Status: DC | PRN
  Administered 2022-06-02: 5 mg via ORAL
  Filled 2022-06-02: qty 2

## 2022-06-02 MED ORDER — FENTANYL CITRATE (PF) 100 MCG/2ML IJ SOLN
INTRAMUSCULAR | Status: DC | PRN
  Administered 2022-06-02 (×3): 100 ug via INTRAVENOUS

## 2022-06-02 MED ORDER — DIPHENHYDRAMINE HCL 50 MG/ML IJ SOLN: 12.5000 mg | Freq: Four times a day (QID) | INTRAMUSCULAR | Status: DC | PRN

## 2022-06-02 MED ORDER — SUCCINYLCHOLINE CHLORIDE 200 MG/10ML IV SOSY
PREFILLED_SYRINGE | INTRAVENOUS | Status: DC | PRN
  Administered 2022-06-02: 140 mg via INTRAVENOUS

## 2022-06-02 MED ORDER — SODIUM CHLORIDE 0.9 % IV SOLN: INTRAVENOUS | Status: DC

## 2022-06-02 MED ORDER — OXYCODONE HCL 5 MG PO TABS: 5.0000 mg | ORAL_TABLET | Freq: Four times a day (QID) | ORAL | 0 refills | Status: DC | PRN

## 2022-06-02 MED ORDER — BUPIVACAINE LIPOSOME 1.3 % IJ SUSP
INTRAMUSCULAR | Status: DC | PRN
  Administered 2022-06-02: 20 mL

## 2022-06-02 NOTE — Transfer of Care (Signed)
Immediate Anesthesia Transfer of Care Note  Patient: Francisco Hamilton [facility] IV  Procedure(s) Performed: XI ROBOTIC ASSISTED LAPAROSCOPIC CHOLECYSTECTOMY  Patient Location: PACU  Anesthesia Type:General  Level of Consciousness: sedated  Airway & Oxygen Therapy: Patient Spontanous Breathing and Patient connected to face mask oxygen  Post-op Assessment: Report given to RN and Post -op Vital signs reviewed and stable  Post vital signs: Reviewed  Last Vitals:  Vitals Value Taken Time  BP 148/91 06/02/22 0208  Temp    Pulse 95 06/02/22 0209  Resp 17 06/02/22 0209  SpO2 96 % 06/02/22 0209  Vitals shown include unvalidated device data.  Last Pain:  Vitals:   06/01/22 2042  TempSrc: Oral  PainSc:          Complications: No notable events documented.

## 2022-06-02 NOTE — Anesthesia Procedure Notes (Signed)
Procedure Name: Intubation Date/Time: 06/02/2022 12:29 AM  Performed by: Rolla Plate, CRNAPre-anesthesia Checklist: Patient identified, Patient being monitored, Timeout performed, Emergency Drugs available and Suction available Patient Re-evaluated:Patient Re-evaluated prior to induction Oxygen Delivery Method: Circle system utilized Preoxygenation: Pre-oxygenation with 100% oxygen Induction Type: IV induction and Rapid sequence Laryngoscope Size: 3 and McGraph Grade View: Grade I Tube type: Oral Tube size: 7.5 mm Number of attempts: 1 Airway Equipment and Method: Stylet Placement Confirmation: ETT inserted through vocal cords under direct vision, positive ETCO2 and breath sounds checked- equal and bilateral Secured at: 23 cm Tube secured with: Tape Dental Injury: Teeth and Oropharynx as per pre-operative assessment

## 2022-06-02 NOTE — Anesthesia Postprocedure Evaluation (Signed)
Anesthesia Post Note  Patient: Aquiles Abresch [place] IV  Procedure(s) Performed: XI ROBOTIC ASSISTED LAPAROSCOPIC CHOLECYSTECTOMY  Patient location during evaluation: PACU Anesthesia Type: General Level of consciousness: awake and alert, oriented and patient cooperative Pain management: pain level controlled Vital Signs Assessment: post-procedure vital signs reviewed and stable Respiratory status: spontaneous breathing, nonlabored ventilation and respiratory function stable Cardiovascular status: blood pressure returned to baseline and stable Postop Assessment: adequate PO intake Anesthetic complications: no   No notable events documented.   Last Vitals:  Vitals:   06/02/22 0309 06/02/22 0339  BP: (!) 149/77 (!) 153/79  Pulse: 82 67  Resp:  18  Temp: 36.6 C 36.5 C  SpO2: 95% 92%    Last Pain:  Vitals:   06/02/22 0335  TempSrc:   PainSc: Asleep                 Darrin Nipper

## 2022-06-02 NOTE — Progress Notes (Signed)
PHARMACIST - PHYSICIAN COMMUNICATION  CONCERNING:  Enoxaparin (Lovenox) for DVT Prophylaxis    RECOMMENDATION: Patient was prescribed enoxaprin 40mg  q24 hours for VTE prophylaxis.   Filed Weights   06/01/22 0442  Weight: (!) 140.6 kg (310 lb)    Body mass index is 42.04 kg/m.  Estimated Creatinine Clearance: 139.8 mL/min (by C-G formula based on SCr of 0.96 mg/dL).   Based on [place] patient is candidate for enoxaparin 0.5mg /kg TBW SQ every 24 hours based on BMI being >30.  DESCRIPTION: Pharmacy has adjusted enoxaparin dose per [facility] policy.  Patient is now receiving enoxaparin 0.5 mg/kg every 24 hours   Renda Rolls, PharmD, [facility] 06/02/2022 12:33 AM

## 2022-06-02 NOTE — Discharge Summary (Signed)
[facility] SURGICAL DISCHARGE SUMMARY  Patient ID: Francisco Hamilton MRN: [id number] DOB/AGE: 47-Jun-1977 47 y.o.  Admit date: 06/01/2022 Discharge date: 06/02/2022  Discharge Diagnoses Patient Active Problem List   Diagnosis Date Noted   Cholecystitis 06/02/2022   Cholecystitis, acute 06/01/2022    Consultants None  Procedures 06/02/2022:  Robotic assisted laparoscopic cholecystectomy   HPI: Francisco Hamilton is a 47 y.o. male seen in consultation at the request of Dr. Cherylann Banas case d/w him in detail.  He presents with an acute onset of abdominal pain in the epigastric area that woke him from his sleep.  Pain was severe lasted at least 6 hours or so.  And he felt like he was hit with a baseball bat.  Patient will also located in the epigastric area. No fam hx of biliary disease. EKG personally revealed no evidence of ischemia and normal sinus rhythm.  Ultrasound as well as MRI and CT scan personally reviewed showing evidence of gallstones with edema distended gallbladder. No evidence of choledocholithiasis, did have increasing LFTs to include a total bilirubin of 1.8 and increase LFTs. He denies any fevers any chills.  He is able to perform more than 4 METS of activity without any shortness of breath or chest pain.  No prior abdominal operations  Hospital Course: Informed consent was obtained and documented, and patient underwent uneventful robotic assisted laparoscopic cholecystectomy (Dr Dahlia Byes, 06/02/2022).  Post-operatively, patient's pain and symptoms improved/resolved and advancement of patient's diet and ambulation were well-tolerated. The remainder of patient's hospital course was essentially unremarkable, and discharge planning was initiated accordingly with patient safely able to be discharged home with appropriate discharge instructions, antibiotics (Augmentin x7 days), pain control, and outpatient follow-up after all of his questions were answered to his  expressed satisfaction.   Discharge Condition: Good   Physical Examination:  Constitutional: Well appearing male, NAD Pulmonary: Normal effort, no respiratory distress Gastrointestinal: Soft, incisional tenderness, non-distended, no rebound/guarding Skin: Laparoscopic incisions are CDI with dermabond, no erythema or drainage    Allergies as of 06/02/2022       Reactions   Nsaids    Hives        Medication List     TAKE these medications    acetaminophen 500 MG tablet Commonly known as: TYLENOL Take 1,000 mg by mouth daily as needed.   amoxicillin-clavulanate 875-125 MG tablet Commonly known as: AUGMENTIN Take 1 tablet by mouth 2 (two) times daily for 7 days.   Cholecalciferol 1.25 MG (50000 UT) capsule Take 1 capsule (50,000 Units total) by mouth once a week. d3   cyclobenzaprine 5 MG tablet Commonly known as: FLEXERIL Take 1 tablet (5 mg total) by mouth at bedtime as needed for muscle spasms.   gabapentin 300 MG capsule Commonly known as: NEURONTIN Take by mouth.   ipratropium 0.06 % nasal spray Commonly known as: ATROVENT Place 2 sprays into both nostrils 3 (three) times daily.   Magnesium Hydroxide 600 MG Chew Chew 1,200 mg by mouth daily.   meloxicam 15 MG tablet Commonly known as: MOBIC Take 1 tablet (15 mg total) by mouth daily.   Mounjaro 12.5 MG/0.5ML Pen Generic drug: tirzepatide Inject 12.5 mg into the skin once a week. Patient takes on Sundays What changed: Another medication with the same name was removed. Continue taking this medication, and follow the directions you see here.   Multi Adult Gummies Chew Chew 1 Units by mouth 2 (two) times daily.   olmesartan-hydrochlorothiazide 20-12.5 MG tablet Commonly  known as: BENICAR HCT TAKE 1 TABLET BY MOUTH DAILY IN THE MORNING. IF BLOOD PRESSURE NOT AT GOAL< 130,<80 AFTER 2 WEEKS, INCREASE TO 2 TABLETS DAILY IN THE MORNING   ondansetron 4 MG disintegrating tablet Commonly known as:  ZOFRAN-ODT Take 1 tablet (4 mg total) by mouth every 6 (six) hours as needed for nausea.   oxyCODONE 5 MG immediate release tablet Commonly known as: Oxy IR/ROXICODONE Take 1 tablet (5 mg total) by mouth every 6 (six) hours as needed for severe pain or breakthrough pain.   phentermine 37.5 MG tablet Commonly known as: ADIPEX-P Take 37.5 mg by mouth daily before breakfast. Dr. Juleen China          Follow-up Information     Tylene Fantasia, PA-C. Go on 06/22/2022.   Specialty: Physician Assistant Why: s/p laparoscopic cholecystectomy. Go at 1:45pm. Contact information: [address] [place] [address] [phone]                  Time spent on discharge management including discussion of hospital course, clinical condition, outpatient instructions, prescriptions, and follow up with the patient and members of the medical team: >30 minutes  -- Edison Simon , PA-C [facility]  06/02/2022, 10:47 AM [phone] M-F: 7am - 4pm

## 2022-06-02 NOTE — Discharge Instructions (Signed)

## 2022-06-02 NOTE — TOC Progression Note (Signed)
Transition of Care [facility]) - Progression Note    Patient Details  Name: Read Scharr MRN: [id number] Date of Birth: 1975/08/14  Transition of Care [facility]) CM/SW Contact  Beverly Sessions, RN Phone Number: 06/02/2022, 9:03 AM  Clinical Narrative:      Transition of Care [facility]) Screening Note   Patient Details  Name: Tion Berhe IV Date of Birth: Aug 18, 1975   Transition of Care (TOC) CM/SW Contact:    Beverly Sessions, RN Phone Number: 06/02/2022, 9:03 AM    Transition of Care Department [facility]) has reviewed patient and no TOC needs have been identified at this time. We will continue to monitor patient advancement through interdisciplinary progression rounds. If new patient transition needs arise, please place a TOC consult.         Expected Discharge Plan and Services         Expected Discharge Date: 06/02/22                                     Social Determinants of Health (SDOH) Interventions SDOH Screenings   Food Insecurity: No Food Insecurity (06/02/2022)  Housing: Low Risk  (06/02/2022)  Transportation Needs: No Transportation Needs (06/02/2022)  Utilities: Not At Risk (06/02/2022)  Depression (PHQ2-9): Low Risk  (08/14/2021)  Tobacco Use: Low Risk  (06/01/2022)    Readmission Risk Interventions     No data to display

## 2022-06-02 NOTE — Op Note (Signed)
Robotic assisted laparoscopic Cholecystectomy  Pre-operative Diagnosis: cholecystitis  Post-operative Diagnosis: same  Procedure:  Robotic assisted laparoscopic Cholecystectomy  Surgeon: Caroleen Hamman, MD FACS  Anesthesia: Gen. with endotracheal tube  Findings: Acute Cholecystitis   Estimated Blood Loss:10 cc       Specimens: Gallbladder           Complications: none   Procedure Details  The patient was seen again in the Holding Room. The benefits, complications, treatment options, and expected outcomes were discussed with the patient. The risks of bleeding, infection, recurrence of symptoms, failure to resolve symptoms, bile duct damage, bile duct leak, retained common bile duct stone, bowel injury, any of which could require further surgery and/or ERCP, stent, or papillotomy were reviewed with the patient. The likelihood of improving the patient's symptoms with return to their baseline status is good.  The patient and/or family concurred with the proposed plan, giving informed consent.  The patient was taken to Operating Room, identified  and the procedure verified as Laparoscopic Cholecystectomy.  A Time Out was held and the above information confirmed.  Prior to the induction of general anesthesia, antibiotic prophylaxis was administered. VTE prophylaxis was in place. General endotracheal anesthesia was then administered and tolerated well. After the induction, the abdomen was prepped with Chloraprep and draped in the sterile fashion. The patient was positioned in the supine position.  Cut down technique was used to enter the abdominal cavity and a Hasson trochar was placed after two vicryl stitches were anchored to the fascia. Pneumoperitoneum was then created with CO2 and tolerated well without any adverse changes in the patient's vital signs.  Three 8-mm ports were placed under direct vision. All skin incisions  were infiltrated with a local anesthetic agent before making the  incision and placing the trocars.   The patient was positioned  in reverse Trendelenburg, robot was brought to the surgical field and docked in the standard fashion.  We made sure all the instrumentation was kept indirect view at all times and that there were no collision between the arms. I scrubbed out and went to the console.  The gallbladder was identified, the fundus grasped and retracted cephalad. Adhesions were lysed bluntly. The infundibulum was grasped and retracted laterally, exposing the peritoneum overlying the triangle of Calot. This was then divided and exposed in a blunt fashion. An extended critical view of the cystic duct and cystic artery was obtained.  The cystic duct was clearly identified and bluntly dissected.   Artery and duct were double clipped and divided. No evidence of bile injuries observed. The gallbladder was taken from the gallbladder fossa in a retrograde fashion with the electrocautery.  Hemostasis was achieved with the electrocautery. nspection of the right upper quadrant was performed. No bleeding, bile duct injury or leak, or bowel injury was noted. Robotic instruments and robotic arms were undocked in the standard fashion.  I scrubbed back in.  The gallbladder was removed and placed in an Endocatch bag.   Pneumoperitoneum was released.  The periumbilical port site was closed with interrumpted 0 Vicryl sutures. 4-0 subcuticular Monocryl was used to close the skin. Dermabond was  applied.  The patient was then extubated and brought to the recovery room in stable condition. Sponge, lap, and needle counts were correct at closure and at the conclusion of the case.               Caroleen Hamman, MD, FACS

## 2022-06-03 LAB — SURGICAL PATHOLOGY

## 2022-06-16 ENCOUNTER — Other Ambulatory Visit: Payer: Self-pay

## 2022-06-16 MED ORDER — MOUNJARO 15 MG/0.5ML ~~LOC~~ SOAJ
15.0000 mg | SUBCUTANEOUS | 3 refills | Status: DC
  Filled 2022-06-18: qty 9, 126d supply, fill #0

## 2022-06-17 ENCOUNTER — Ambulatory Visit (INDEPENDENT_AMBULATORY_CARE_PROVIDER_SITE_OTHER): Payer: BC Managed Care – PPO | Admitting: Family Medicine

## 2022-06-17 VITALS — BP 128/80 | HR 82 | Temp 98.3°F | Ht 72.0 in | Wt 300.4 lb

## 2022-06-17 DIAGNOSIS — E559 Vitamin D deficiency, unspecified: Secondary | ICD-10-CM

## 2022-06-17 DIAGNOSIS — I10 Essential (primary) hypertension: Secondary | ICD-10-CM | POA: Diagnosis not present

## 2022-06-17 DIAGNOSIS — I152 Hypertension secondary to endocrine disorders: Secondary | ICD-10-CM

## 2022-06-17 DIAGNOSIS — E1159 Type 2 diabetes mellitus with other circulatory complications: Secondary | ICD-10-CM | POA: Diagnosis not present

## 2022-06-17 DIAGNOSIS — E118 Type 2 diabetes mellitus with unspecified complications: Secondary | ICD-10-CM

## 2022-06-17 DIAGNOSIS — Z1159 Encounter for screening for other viral diseases: Secondary | ICD-10-CM

## 2022-06-17 NOTE — Progress Notes (Signed)
SUBJECTIVE:   Chief Complaint  Patient presents with   Transitions Of Care   HPI Patient presents to clinic for transfer care.  No acute concerns today.  DM type II Asymptomatic.  Currently on Mounjaro 15 mg weekly, managed by [facility].  Denies any polyuria, polyphagia or polydipsia.  Not on ACEi/ARB or statin therapy.  Recent eye exam and foot exam.  Follows with [facility] [place] [facility].  Hypertension Asymptomatic.  Previously prescribed Benicar HCT 20-12.5 mg daily.  Self discontinued 1 year ago.  Reports blood pressure has been good since.  Morbid obesity Follows with [facility].  Currently takes Mounjaro 50 mg weekly, Adipex 37.5 mg daily and tolerating medication well.  Weight down 122 pound since 05/22.  Hyperlipidemia Not currently on statin therapy.  Recent LDL 93, goal less than 70 for diabetes and hypertension.  PERTINENT PMH / PSH: Hypertension Hyperlipidemia Morbid obesity Vitamin D deficiency Status post lap cholecystectomy 06/01/2022  OBJECTIVE:  BP 128/80   Pulse 82   Temp 98.3 F (36.8 C) (Oral)   Ht 6' (1.829 m)   Wt (!) 300 lb 6.4 oz (136.3 kg)   SpO2 97%   BMI 40.74 kg/m    Physical Exam Vitals reviewed.  Constitutional:      General: He is not in acute distress.    Appearance: He is normal weight. He is not ill-appearing.  HENT:     Head: Normocephalic.  Eyes:     Conjunctiva/sclera: Conjunctivae normal.  Neck:     Thyroid: No thyromegaly or thyroid tenderness.  Cardiovascular:     Rate and Rhythm: Normal rate and regular rhythm.     Pulses: Normal pulses.  Pulmonary:     Effort: Pulmonary effort is normal.     Breath sounds: Normal breath sounds.  Abdominal:     General: Bowel sounds are normal.  Neurological:     Mental Status: He is alert. Mental status is at baseline.  Psychiatric:        Mood and Affect: Mood normal.        Behavior: Behavior normal.        Thought Content: Thought  content normal.        Judgment: Judgment normal.     ASSESSMENT/PLAN:  Hypertension associated with diabetes (HCC) Assessment & Plan: Chronic.  Asymptomatic, controlled.  Self discontinued antihypertensives.  Discussed goal BP less than 130/80 and current BP borderline. Check c-Met, and urine ACR Monitor blood pressure at home.  Goal less than 130/80 Discussed with patient if blood pressure increases likely will need to restart ARB therapy.  Orders: -     Comprehensive metabolic panel; Future -     TSH; Future  Type 2 diabetes mellitus with complication, without long-term current use of insulin (HCC) Assessment & Plan: Chronic.  Well-controlled. Recent A1c 6.2. Self discontinued ARB Not on statin therapy, LDL not at goal less than 70 Mounjaro 15 mg weekly, tolerating well.  Managed by [facility] weight clinic Check A1c, CMET, and urine ACR, and lipids today If lipids elevated recommend statin therapy Follow-up in 6 months    Orders: -     Hemoglobin A1c; Future -     Vitamin B12; Future  Morbid obesity (HCC) Assessment & Plan: BMI 39.58 with comorbidity hypertension and diabetes. Compliant with Mounjaro 15 mg weekly and phentermine 5 mg daily. Follows with Dr. Earlene Plater at [place] weight loss clinic.   Orders: -  CBC with Differential/Platelet; Future -     Lipid panel; Future  Vitamin D deficiency Assessment & Plan: Chronic.  Takes vitamin D supplements Check vitamin D level  Orders: -     Microalbumin / creatinine urine ratio; Future -     VITAMIN D 25 Hydroxy (Vit-D Deficiency, Fractures); Future  Encounter for hepatitis C screening test for low risk patient -     Hepatitis C antibody; Future  HCM Reports eye and foot exam completed at [facility] and [facility].  Request records Recommend tetanus vaccine Recommend pneumonia 20 vaccine Tdap up-to-date Colonoscopy up-to-date.   Hepatitis C/HIV screening labs today  PDMP  reviewed  Return if symptoms worsen or fail to improve, for PCP.  Dana Allan, MD

## 2022-06-17 NOTE — Patient Instructions (Signed)
It was a pleasure meeting you today. Thank you for allowing me to take part in your health care.  Our goals for today as we discussed include:  We will get some labs today.  If they are abnormal or we need to do something about them, I will call you.  If they are normal, I will send you a message on MyChart (if it is active) or a letter in the mail.  If you don't hear from [place] in 2 weeks, please call the office at the number below.   Follow up with Dr Earlene Plater for weight loss management  Follow up with PCP as needed  If you have any questions or concerns, please do not hesitate to call the office at [phone].  I look forward to our next visit and until then take care and stay safe.  Regards,   Dana Allan, MD   [facility]

## 2022-06-18 ENCOUNTER — Other Ambulatory Visit: Payer: Self-pay

## 2022-06-22 ENCOUNTER — Other Ambulatory Visit: Payer: Self-pay

## 2022-06-22 ENCOUNTER — Encounter: Payer: Self-pay | Admitting: Physician Assistant

## 2022-06-22 ENCOUNTER — Ambulatory Visit (INDEPENDENT_AMBULATORY_CARE_PROVIDER_SITE_OTHER): Payer: BC Managed Care – PPO | Admitting: Physician Assistant

## 2022-06-22 VITALS — BP 124/85 | HR 83 | Temp 98.0°F | Ht 73.0 in | Wt 300.0 lb

## 2022-06-22 DIAGNOSIS — K810 Acute cholecystitis: Secondary | ICD-10-CM

## 2022-06-22 DIAGNOSIS — Z09 Encounter for follow-up examination after completed treatment for conditions other than malignant neoplasm: Secondary | ICD-10-CM

## 2022-06-22 NOTE — Patient Instructions (Signed)

## 2022-06-22 NOTE — Progress Notes (Signed)
[facility] POST-OP OFFICE VISIT  06/22/2022  HPI: Francisco Hamilton is a 47 y.o. male 20 days s/p robotic assisted laparoscopic cholecystectomy for acute cholecystitis with Dr Everlene Farrier   He has done very well since surgery At this point. He denied any abdominal pain No fever, chills, nausea, emesis He does endorse looser stools but this is similar to his baseline pre-surgery; He is mindful of what he eats Incisions are well healed Ambulating well No other complaints   Vital signs: BP 124/85   Pulse 83   Temp 98 F (36.7 C)   Ht  (1.854 m)   Wt 300 lb (136.1 kg)   SpO2 98%   BMI 39.58 kg/m    Physical Exam: Constitutional: Well appearing male, NAD Abdomen: Soft, non-tender, non-distended, no rebound/guarding Skin: Laparoscopic incisions are healing well, no erythema or drainage   Assessment/Plan: This is a 47 y.o. male  20 days s/p robotic assisted laparoscopic cholecystectomy for acute cholecystitis with Dr Everlene Farrier    - Pain control prn  - Reviewed wound care recommendation  - Reviewed lifting restrictions; 4 weeks total (1 more week)  - Reviewed surgical pathology; CCC  - He can follow up on as needed basis; He understands to call with questions/concerns  -- Lynden Oxford, PA-C [facility] 06/22/2022, 2:25 PM M-F: 7am - 4pm

## 2022-06-25 ENCOUNTER — Other Ambulatory Visit (INDEPENDENT_AMBULATORY_CARE_PROVIDER_SITE_OTHER): Payer: BC Managed Care – PPO

## 2022-06-25 DIAGNOSIS — E559 Vitamin D deficiency, unspecified: Secondary | ICD-10-CM | POA: Diagnosis not present

## 2022-06-25 DIAGNOSIS — E118 Type 2 diabetes mellitus with unspecified complications: Secondary | ICD-10-CM | POA: Diagnosis not present

## 2022-06-25 DIAGNOSIS — I10 Essential (primary) hypertension: Secondary | ICD-10-CM

## 2022-06-25 DIAGNOSIS — Z1159 Encounter for screening for other viral diseases: Secondary | ICD-10-CM | POA: Diagnosis not present

## 2022-06-25 LAB — COMPREHENSIVE METABOLIC PANEL
ALT: 20 U/L (ref 0–53)
AST: 16 U/L (ref 0–37)
Albumin: 4.1 g/dL (ref 3.5–5.2)
Alkaline Phosphatase: 107 U/L (ref 39–117)
BUN: 12 mg/dL (ref 6–23)
CO2: 25 mEq/L (ref 19–32)
Calcium: 9 mg/dL (ref 8.4–10.5)
Chloride: 105 mEq/L (ref 96–112)
Creatinine, Ser: 0.9 mg/dL (ref 0.40–1.50)
GFR: 102.32 mL/min (ref >60.00)
Glucose, Bld: 73 mg/dL (ref 70–99)
Potassium: 4.2 mEq/L (ref 3.5–5.1)
Sodium: 141 mEq/L (ref 135–145)
Total Bilirubin: 0.4 mg/dL (ref 0.2–1.2)
Total Protein: 7.2 g/dL (ref 6.0–8.3)

## 2022-06-25 LAB — VITAMIN D 25 HYDROXY (VIT D DEFICIENCY, FRACTURES): VITD: 41.26 ng/mL (ref 30.00–100.00)

## 2022-06-25 LAB — CBC WITH DIFFERENTIAL/PLATELET
Basophils Absolute: 0 10*3/uL (ref 0.0–0.1)
Basophils Relative: 0.7 % (ref 0.0–3.0)
Eosinophils Absolute: 0.1 10*3/uL (ref 0.0–0.7)
Eosinophils Relative: 1.9 % (ref 0.0–5.0)
HCT: 42.9 % (ref 39.0–52.0)
Hemoglobin: 14.4 g/dL (ref 13.0–17.0)
Lymphocytes Relative: 34.8 % (ref 12.0–46.0)
Lymphs Abs: 1.6 10*3/uL (ref 0.7–4.0)
MCHC: 33.5 g/dL (ref 30.0–36.0)
MCV: 87.1 fl (ref 78.0–100.0)
Monocytes Absolute: 0.4 10*3/uL (ref 0.1–1.0)
Monocytes Relative: 8.1 % (ref 3.0–12.0)
Neutro Abs: 2.6 10*3/uL (ref 1.4–7.7)
Neutrophils Relative %: 54.5 % (ref 43.0–77.0)
Platelets: 258 10*3/uL (ref 150.0–400.0)
RBC: 4.93 Mil/uL (ref 4.22–5.81)
RDW: 13.5 % (ref 11.5–15.5)
WBC: 4.7 10*3/uL (ref 4.0–10.5)

## 2022-06-25 LAB — LIPID PANEL
Cholesterol: 127 mg/dL (ref 0–200)
HDL: 31.7 mg/dL — ABNORMAL LOW (ref >39.00)
LDL Cholesterol: 80 mg/dL (ref 0–99)
NonHDL: 94.8
Total CHOL/HDL Ratio: 4
Triglycerides: 74 mg/dL (ref 0.0–149.0)
VLDL: 14.8 mg/dL (ref 0.0–40.0)

## 2022-06-25 LAB — HEMOGLOBIN A1C: Hgb A1c MFr Bld: 5.7 % (ref 4.6–6.5)

## 2022-06-25 LAB — VITAMIN B12: Vitamin B-12: 190 pg/mL — ABNORMAL LOW (ref 211–911)

## 2022-06-25 LAB — TSH: TSH: 0.66 u[IU]/mL (ref 0.35–5.50)

## 2022-06-25 LAB — MICROALBUMIN / CREATININE URINE RATIO
Creatinine,U: 163.5 mg/dL
Microalb Creat Ratio: 0.4 mg/g (ref 0.0–30.0)
Microalb, Ur: <0.7 mg/dL (ref 0.0–1.9)

## 2022-06-26 ENCOUNTER — Other Ambulatory Visit: Payer: Self-pay | Admitting: Family Medicine

## 2022-06-26 ENCOUNTER — Encounter: Payer: Self-pay | Admitting: Family Medicine

## 2022-06-26 DIAGNOSIS — E538 Deficiency of other specified B group vitamins: Secondary | ICD-10-CM

## 2022-06-26 DIAGNOSIS — Z1159 Encounter for screening for other viral diseases: Secondary | ICD-10-CM | POA: Insufficient documentation

## 2022-06-26 LAB — HEPATITIS C ANTIBODY: Hepatitis C Ab: NONREACTIVE

## 2022-06-26 MED ORDER — VITAMIN B-12 1000 MCG SL SUBL
1000.0000 ug | SUBLINGUAL_TABLET | Freq: Every day | SUBLINGUAL | 3 refills | Status: AC
Start: 2022-06-26 — End: ?

## 2022-06-26 NOTE — Assessment & Plan Note (Signed)
BMI 39.58 with comorbidity hypertension and diabetes. Compliant with Mounjaro 15 mg weekly and phentermine 5 mg daily. Follows with Dr. Earlene Plater at [place] weight loss clinic.

## 2022-06-26 NOTE — Assessment & Plan Note (Signed)
Chronic.  Takes vitamin D supplements Check vitamin D level

## 2022-06-26 NOTE — Assessment & Plan Note (Signed)
Chronic.  Asymptomatic, controlled.  Self discontinued antihypertensives.  Discussed goal BP less than 130/80 and current BP borderline. Check c-Met, and urine ACR Monitor blood pressure at home.  Goal less than 130/80 Discussed with patient if blood pressure increases likely will need to restart ARB therapy.

## 2022-06-26 NOTE — Assessment & Plan Note (Addendum)
Chronic.  Well-controlled. Recent A1c 6.2. Self discontinued ARB Not on statin therapy, LDL not at goal less than 70 Mounjaro 15 mg weekly, tolerating well.  Managed by [facility] weight clinic Check A1c, CMET, and urine ACR, and lipids today If lipids elevated recommend statin therapy Follow-up in 6 months

## 2022-06-28 DIAGNOSIS — E65 Localized adiposity: Secondary | ICD-10-CM | POA: Diagnosis not present

## 2022-06-28 DIAGNOSIS — E1169 Type 2 diabetes mellitus with other specified complication: Secondary | ICD-10-CM | POA: Diagnosis not present

## 2022-06-28 DIAGNOSIS — Z9189 Other specified personal risk factors, not elsewhere classified: Secondary | ICD-10-CM | POA: Diagnosis not present

## 2022-07-27 ENCOUNTER — Other Ambulatory Visit (HOSPITAL_COMMUNITY): Payer: Self-pay

## 2022-07-27 DIAGNOSIS — E65 Localized adiposity: Secondary | ICD-10-CM | POA: Diagnosis not present

## 2022-07-27 DIAGNOSIS — R632 Polyphagia: Secondary | ICD-10-CM | POA: Diagnosis not present

## 2022-07-27 DIAGNOSIS — E1169 Type 2 diabetes mellitus with other specified complication: Secondary | ICD-10-CM | POA: Diagnosis not present

## 2022-07-27 MED ORDER — MOUNJARO 15 MG/0.5ML ~~LOC~~ SOAJ
SUBCUTANEOUS | 0 refills | Status: DC
  Filled 2022-07-27: qty 2, 28d supply, fill #0

## 2022-08-12 ENCOUNTER — Encounter: Payer: BC Managed Care – PPO | Admitting: Family Medicine

## 2022-08-24 ENCOUNTER — Other Ambulatory Visit (HOSPITAL_COMMUNITY): Payer: Self-pay

## 2022-08-24 DIAGNOSIS — E1169 Type 2 diabetes mellitus with other specified complication: Secondary | ICD-10-CM | POA: Diagnosis not present

## 2022-08-24 DIAGNOSIS — R632 Polyphagia: Secondary | ICD-10-CM | POA: Diagnosis not present

## 2022-08-24 DIAGNOSIS — K7581 Nonalcoholic steatohepatitis (NASH): Secondary | ICD-10-CM | POA: Diagnosis not present

## 2022-08-24 MED ORDER — MOUNJARO 15 MG/0.5ML ~~LOC~~ SOAJ
15.0000 mg | SUBCUTANEOUS | 0 refills | Status: DC
  Filled 2022-08-24: qty 2, 28d supply, fill #0

## 2022-08-26 ENCOUNTER — Other Ambulatory Visit (HOSPITAL_COMMUNITY): Payer: Self-pay

## 2022-08-26 MED ORDER — PHENTERMINE HCL 37.5 MG PO TABS
37.5000 mg | ORAL_TABLET | Freq: Every day | ORAL | 0 refills | Status: DC
  Filled 2022-08-26: qty 90, 90d supply, fill #0

## 2022-08-27 ENCOUNTER — Other Ambulatory Visit (HOSPITAL_COMMUNITY): Payer: Self-pay

## 2022-08-31 ENCOUNTER — Other Ambulatory Visit (HOSPITAL_COMMUNITY): Payer: Self-pay

## 2022-09-20 DIAGNOSIS — E1169 Type 2 diabetes mellitus with other specified complication: Secondary | ICD-10-CM | POA: Diagnosis not present

## 2022-09-20 DIAGNOSIS — K7581 Nonalcoholic steatohepatitis (NASH): Secondary | ICD-10-CM | POA: Diagnosis not present

## 2022-09-20 DIAGNOSIS — N62 Hypertrophy of breast: Secondary | ICD-10-CM | POA: Diagnosis not present

## 2022-09-20 DIAGNOSIS — Z9189 Other specified personal risk factors, not elsewhere classified: Secondary | ICD-10-CM | POA: Diagnosis not present

## 2022-09-21 ENCOUNTER — Other Ambulatory Visit (HOSPITAL_COMMUNITY): Payer: Self-pay

## 2022-09-21 MED ORDER — MOUNJARO 15 MG/0.5ML ~~LOC~~ SOAJ
15.0000 mg | SUBCUTANEOUS | 2 refills | Status: DC
  Filled 2022-10-01 – 2022-10-09 (×2): qty 2, 28d supply, fill #0

## 2022-09-25 ENCOUNTER — Other Ambulatory Visit (HOSPITAL_COMMUNITY): Payer: Self-pay

## 2022-10-01 ENCOUNTER — Other Ambulatory Visit (HOSPITAL_COMMUNITY): Payer: Self-pay

## 2022-10-01 ENCOUNTER — Other Ambulatory Visit: Payer: Self-pay

## 2022-10-06 ENCOUNTER — Other Ambulatory Visit (HOSPITAL_COMMUNITY): Payer: Self-pay

## 2022-10-07 ENCOUNTER — Other Ambulatory Visit (HOSPITAL_COMMUNITY): Payer: Self-pay

## 2022-10-07 MED ORDER — MOUNJARO 12.5 MG/0.5ML ~~LOC~~ SOAJ
12.5000 mg | SUBCUTANEOUS | 2 refills | Status: DC
  Filled 2022-10-07 – 2022-11-09 (×2): qty 2, 28d supply, fill #0
  Filled 2022-12-07: qty 2, 28d supply, fill #1
  Filled 2023-01-07: qty 2, 28d supply, fill #2

## 2022-10-09 ENCOUNTER — Other Ambulatory Visit (HOSPITAL_COMMUNITY): Payer: Self-pay

## 2022-10-21 DIAGNOSIS — K7581 Nonalcoholic steatohepatitis (NASH): Secondary | ICD-10-CM | POA: Diagnosis not present

## 2022-10-21 DIAGNOSIS — R632 Polyphagia: Secondary | ICD-10-CM | POA: Diagnosis not present

## 2022-10-21 DIAGNOSIS — E1169 Type 2 diabetes mellitus with other specified complication: Secondary | ICD-10-CM | POA: Diagnosis not present

## 2022-11-09 ENCOUNTER — Other Ambulatory Visit (HOSPITAL_COMMUNITY): Payer: Self-pay

## 2022-12-11 ENCOUNTER — Other Ambulatory Visit (HOSPITAL_COMMUNITY): Payer: Self-pay

## 2022-12-16 ENCOUNTER — Other Ambulatory Visit (HOSPITAL_COMMUNITY): Payer: Self-pay

## 2022-12-16 DIAGNOSIS — R632 Polyphagia: Secondary | ICD-10-CM | POA: Diagnosis not present

## 2022-12-16 DIAGNOSIS — Z6837 Body mass index (BMI) 37.0-37.9, adult: Secondary | ICD-10-CM | POA: Diagnosis not present

## 2022-12-16 DIAGNOSIS — E1169 Type 2 diabetes mellitus with other specified complication: Secondary | ICD-10-CM | POA: Diagnosis not present

## 2022-12-16 DIAGNOSIS — Z9189 Other specified personal risk factors, not elsewhere classified: Secondary | ICD-10-CM | POA: Diagnosis not present

## 2022-12-16 DIAGNOSIS — K7581 Nonalcoholic steatohepatitis (NASH): Secondary | ICD-10-CM | POA: Diagnosis not present

## 2022-12-16 LAB — HM DIABETES EYE EXAM

## 2022-12-16 MED ORDER — PHENTERMINE HCL 37.5 MG PO TABS
37.5000 mg | ORAL_TABLET | ORAL | 0 refills | Status: DC
  Filled 2022-12-16 – 2023-06-10 (×3): qty 90, 90d supply, fill #0

## 2022-12-18 ENCOUNTER — Other Ambulatory Visit: Payer: Self-pay

## 2023-01-13 DIAGNOSIS — K7581 Nonalcoholic steatohepatitis (NASH): Secondary | ICD-10-CM | POA: Diagnosis not present

## 2023-01-13 DIAGNOSIS — I10 Essential (primary) hypertension: Secondary | ICD-10-CM | POA: Diagnosis not present

## 2023-01-13 DIAGNOSIS — Z9189 Other specified personal risk factors, not elsewhere classified: Secondary | ICD-10-CM | POA: Diagnosis not present

## 2023-01-13 DIAGNOSIS — E118 Type 2 diabetes mellitus with unspecified complications: Secondary | ICD-10-CM | POA: Diagnosis not present

## 2023-02-12 ENCOUNTER — Other Ambulatory Visit (HOSPITAL_COMMUNITY): Payer: Self-pay

## 2023-02-16 ENCOUNTER — Encounter: Payer: Self-pay | Admitting: Family Medicine

## 2023-02-16 NOTE — Telephone Encounter (Signed)
 Care team updated and letter sent for eye exam notes.

## 2023-02-17 ENCOUNTER — Other Ambulatory Visit (HOSPITAL_COMMUNITY): Payer: Self-pay

## 2023-03-17 DIAGNOSIS — E1169 Type 2 diabetes mellitus with other specified complication: Secondary | ICD-10-CM | POA: Diagnosis not present

## 2023-03-17 DIAGNOSIS — L720 Epidermal cyst: Secondary | ICD-10-CM | POA: Diagnosis not present

## 2023-03-17 DIAGNOSIS — R632 Polyphagia: Secondary | ICD-10-CM | POA: Diagnosis not present

## 2023-03-17 DIAGNOSIS — I10 Essential (primary) hypertension: Secondary | ICD-10-CM | POA: Diagnosis not present

## 2023-03-29 ENCOUNTER — Other Ambulatory Visit: Payer: Self-pay

## 2023-03-29 MED ORDER — MOUNJARO 12.5 MG/0.5ML ~~LOC~~ SOAJ
SUBCUTANEOUS | 3 refills | Status: DC
  Filled 2023-03-29 – 2023-04-02 (×2): qty 6, 84d supply, fill #0
  Filled 2023-07-08 – 2023-07-22 (×2): qty 6, 84d supply, fill #1

## 2023-04-02 ENCOUNTER — Other Ambulatory Visit: Payer: Self-pay

## 2023-04-02 ENCOUNTER — Other Ambulatory Visit (HOSPITAL_COMMUNITY): Payer: Self-pay

## 2023-04-08 ENCOUNTER — Other Ambulatory Visit: Payer: Self-pay

## 2023-04-14 DIAGNOSIS — E1169 Type 2 diabetes mellitus with other specified complication: Secondary | ICD-10-CM | POA: Diagnosis not present

## 2023-04-14 DIAGNOSIS — I10 Essential (primary) hypertension: Secondary | ICD-10-CM | POA: Diagnosis not present

## 2023-04-14 DIAGNOSIS — R632 Polyphagia: Secondary | ICD-10-CM | POA: Diagnosis not present

## 2023-04-20 ENCOUNTER — Other Ambulatory Visit (HOSPITAL_COMMUNITY): Payer: Self-pay

## 2023-04-20 MED ORDER — PHENTERMINE HCL 37.5 MG PO TABS
37.5000 mg | ORAL_TABLET | Freq: Every day | ORAL | 0 refills | Status: DC
  Filled 2023-04-20: qty 30, 30d supply, fill #0

## 2023-05-09 ENCOUNTER — Other Ambulatory Visit: Payer: Self-pay

## 2023-05-12 DIAGNOSIS — E559 Vitamin D deficiency, unspecified: Secondary | ICD-10-CM | POA: Diagnosis not present

## 2023-05-12 DIAGNOSIS — D170 Benign lipomatous neoplasm of skin and subcutaneous tissue of head, face and neck: Secondary | ICD-10-CM | POA: Diagnosis not present

## 2023-05-12 DIAGNOSIS — E1169 Type 2 diabetes mellitus with other specified complication: Secondary | ICD-10-CM | POA: Diagnosis not present

## 2023-05-12 DIAGNOSIS — R632 Polyphagia: Secondary | ICD-10-CM | POA: Diagnosis not present

## 2023-06-10 ENCOUNTER — Other Ambulatory Visit: Payer: Self-pay

## 2023-06-10 ENCOUNTER — Other Ambulatory Visit (HOSPITAL_COMMUNITY): Payer: Self-pay

## 2023-06-20 ENCOUNTER — Other Ambulatory Visit (HOSPITAL_COMMUNITY): Payer: Self-pay

## 2023-07-08 DIAGNOSIS — E559 Vitamin D deficiency, unspecified: Secondary | ICD-10-CM | POA: Diagnosis not present

## 2023-07-08 DIAGNOSIS — E1169 Type 2 diabetes mellitus with other specified complication: Secondary | ICD-10-CM | POA: Diagnosis not present

## 2023-07-08 DIAGNOSIS — R632 Polyphagia: Secondary | ICD-10-CM | POA: Diagnosis not present

## 2023-07-08 DIAGNOSIS — E65 Localized adiposity: Secondary | ICD-10-CM | POA: Diagnosis not present

## 2023-07-18 ENCOUNTER — Other Ambulatory Visit (HOSPITAL_COMMUNITY): Payer: Self-pay

## 2023-08-05 DIAGNOSIS — E1169 Type 2 diabetes mellitus with other specified complication: Secondary | ICD-10-CM | POA: Diagnosis not present

## 2023-08-05 DIAGNOSIS — R632 Polyphagia: Secondary | ICD-10-CM | POA: Diagnosis not present

## 2023-08-05 DIAGNOSIS — I10 Essential (primary) hypertension: Secondary | ICD-10-CM | POA: Diagnosis not present

## 2023-09-08 ENCOUNTER — Ambulatory Visit: Admitting: Plastic Surgery

## 2023-09-08 ENCOUNTER — Encounter: Payer: Self-pay | Admitting: Plastic Surgery

## 2023-09-08 VITALS — BP 142/84 | HR 73 | Ht 71.0 in | Wt 273.0 lb

## 2023-09-08 DIAGNOSIS — R220 Localized swelling, mass and lump, head: Secondary | ICD-10-CM

## 2023-09-08 DIAGNOSIS — D489 Neoplasm of uncertain behavior, unspecified: Secondary | ICD-10-CM

## 2023-09-08 NOTE — Progress Notes (Signed)
 Referring Provider Prentiss Frieze, DO [address] [place],  [place] [address]   CC:  Chief Complaint  Patient presents with   Advice Only   Skin Problem      Francisco Hamilton is an 48 y.o. male.  HPI: Francisco Hamilton is a 48 year old male who presents today with complaints of a mass on the right side of his forehead.  States it has been there for greater than 5 years.  He is not sure if it has grown any during that time.  He has had a considerable weight loss and the weight loss may have made the mass more prominent.  Either way he would like to have it removed for pathologic diagnosis.  Allergies  Allergen Reactions   Nsaids     Hives     Outpatient Encounter Medications as of 09/08/2023  Medication Sig   ascorbic acid (VITAMIN C) 500 MG tablet Take 500 mg by mouth daily.   Cholecalciferol  (VITAMIN D ) 50 MCG (2000 UT) CAPS Take 1 capsule by mouth daily.   Cyanocobalamin  (VITAMIN B-12) 1000 MCG SUBL Place 1 tablet (1,000 mcg total) under the tongue daily.   ipratropium (ATROVENT) 0.06 % nasal spray Place 2 sprays into both nostrils 3 (three) times daily.   Multiple Vitamins-Minerals (MULTI ADULT GUMMIES) CHEW Chew 1 Units by mouth 2 (two) times daily.   phentermine  (ADIPEX-P ) 37.5 MG tablet Take 37.5 mg by mouth daily before breakfast. Dr. Prentiss   phentermine  (ADIPEX-P ) 37.5 MG tablet Take 1 tablet (37.5 mg total) by mouth every morning before breakfast.   phentermine  (ADIPEX-P ) 37.5 MG tablet Take 1 tablet (37.5 mg total) by mouth daily before breakfast   tirzepatide  (MOUNJARO ) 12.5 MG/0.5ML Pen Inject 12.5 mg into the skin once a week.   tirzepatide  (MOUNJARO ) 12.5 MG/0.5ML Pen Inject 12.5 mg under the skin once weekly. 90 days   tirzepatide  (MOUNJARO ) 15 MG/0.5ML Pen Inject 15 mg into the skin once a week on  Sundays   tirzepatide  (MOUNJARO ) 15 MG/0.5ML Pen Inject 15 mg into the skin once a week.   No facility-administered encounter medications on file as of 09/08/2023.      Past Medical History:  Diagnosis Date   Allergy    COVID-19    06/2021 cruise   History of chicken pox    Hypertension    tx'ed in 20s off meds since     Past Surgical History:  Procedure Laterality Date   left knee arthroscopy     high school menscus issue    Family History  Problem Relation Age of Onset   Diabetes Father    Heart disease Sister        valvular heart disease   Diabetes Paternal Grandmother    Hypertension Paternal Grandmother    Heart disease Paternal Grandmother        valvular heart disease died in late 26s    Multiple myeloma Paternal Grandmother    Diabetes Paternal Grandfather    Hypertension Paternal Grandfather     Social History   Social History Narrative   Married to [place]   From [place]   2 kids age 78 and 65 as of 07/2020, 2 sons 56 and 17 08/04/20    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  No complaints of pain.  Physical Exam    09/08/2023   10:05 AM 06/22/2022    1:58 PM 06/17/2022    8:53 AM  Vitals with BMI  Height 5' 11 6' 1 6' 0  Weight 273 lbs 300 lbs 300 lbs 6 oz  BMI 38.09 39.59 40.73  Systolic 142 124 871  Diastolic 84 85 80  Pulse 73 83 82    General:  No acute distress,  Alert and oriented, Non-Toxic, Normal speech and affect Forehead: Patient has a soft mobile nontender mass on the forehead at the lateral border of the frontalis muscle.   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  We discussed the fact that these are benign.  Due to the location it is reasonable to remove it for aesthetic reasons that is also reasonable to remove it for pathologic diagnosis since there is a question of whether it is changed over the past 5 years.  I  showed him where the location of the incision would be and we discussed the fact that I will use permanent sutures which will need to be removed 5 to 7 days postoperatively.  He understands that there is a chance of recurrence and we may need to perform additional procedures if it returns to something other than a lipoma.  All questions were answered.  Photographs were obtained today with his consent.  Will schedule him for an in office procedure at his request.  Francisco Hamilton 09/08/2023, 10:23 AM

## 2023-09-09 DIAGNOSIS — R632 Polyphagia: Secondary | ICD-10-CM | POA: Diagnosis not present

## 2023-09-09 DIAGNOSIS — M25511 Pain in right shoulder: Secondary | ICD-10-CM | POA: Diagnosis not present

## 2023-09-09 DIAGNOSIS — D170 Benign lipomatous neoplasm of skin and subcutaneous tissue of head, face and neck: Secondary | ICD-10-CM | POA: Diagnosis not present

## 2023-09-09 DIAGNOSIS — E1169 Type 2 diabetes mellitus with other specified complication: Secondary | ICD-10-CM | POA: Diagnosis not present

## 2023-10-04 ENCOUNTER — Other Ambulatory Visit (HOSPITAL_COMMUNITY): Payer: Self-pay

## 2023-10-04 MED ORDER — PHENTERMINE HCL 37.5 MG PO TABS
37.5000 mg | ORAL_TABLET | Freq: Every day | ORAL | 0 refills | Status: DC
  Filled 2023-10-04: qty 90, 90d supply, fill #0

## 2023-10-04 MED ORDER — MOUNJARO 12.5 MG/0.5ML ~~LOC~~ SOAJ
12.5000 mg | SUBCUTANEOUS | 1 refills | Status: DC
  Filled 2023-10-04: qty 6, 84d supply, fill #0
  Filled 2024-01-16: qty 6, 84d supply, fill #1

## 2023-10-05 ENCOUNTER — Encounter: Payer: Self-pay | Admitting: Plastic Surgery

## 2023-10-12 ENCOUNTER — Encounter: Payer: Self-pay | Admitting: Plastic Surgery

## 2023-10-12 ENCOUNTER — Ambulatory Visit: Admitting: Plastic Surgery

## 2023-10-12 VITALS — BP 120/76 | HR 85

## 2023-10-12 DIAGNOSIS — D170 Benign lipomatous neoplasm of skin and subcutaneous tissue of head, face and neck: Secondary | ICD-10-CM

## 2023-10-12 DIAGNOSIS — D489 Neoplasm of uncertain behavior, unspecified: Secondary | ICD-10-CM

## 2023-10-12 NOTE — Progress Notes (Signed)
 Procedure Note  Preoperative Dx: Forehead soft tissue mass  Postoperative Dx: Same  Procedure: Excision of forehead mass  Anesthesia: Lidocaine  1% with 1:100,000 epinephrine  and 0.25% Sensorcaine    Indication for Procedure: Removal for pathologic diagnosis  Description of Procedure: Risks and complications were explained to the patient including recurrence and scar formation.  Consent was confirmed and the patient understands the risks and benefits.  The potential complications and alternatives were explained and the patient consents.  The patient expressed understanding the option of not having the procedure and the risks of a scar.  Time out was called and all information was confirmed to be correct.    The area was prepped and drapped.  Local anesthetic was injected in the subcutaneous tissues.  After waiting for the local to take affect a 2 cm transverse incision was made over the mass.  A combination of sharp and blunt dissection were used to isolate and remove the mass forehead location.  After obtaining hemostasis, the surgical wound was closed with 4-0 Monocryl in the muscular layer 4-0 Monocryl in the dermis and interrupted 5-0 Prolene sutures in the skin.  The surgical wound measured 2 cm.  A dressing was applied.  The patient was given instructions on how to care for the area and a follow up appointment.  Francisco Hamilton tolerated the procedure well and there were no complications. The specimen was sent to pathology.

## 2023-10-17 LAB — DERMATOLOGY PATHOLOGY

## 2023-10-17 NOTE — Progress Notes (Unsigned)
 Patient is s/p excision of forehead soft tissue mass performed 10/12/2023 by Dr. Waddell who returns to clinic for postprocedural follow-up.  Reviewed procedural note and the wound was closed with 4-0 Monocryl in the muscular layer, 4-0 Monocryl in the dermis, and then interrupted 5-0 Prolene for the skin.  Surgical wound measured 4 cm.  The specimen was sent to pathology and is still pending.  Physical exam from initial consult had been consistent with lipoma.

## 2023-10-18 ENCOUNTER — Ambulatory Visit: Admitting: Physician Assistant

## 2023-10-18 VITALS — BP 133/84 | HR 87

## 2023-10-18 DIAGNOSIS — D170 Benign lipomatous neoplasm of skin and subcutaneous tissue of head, face and neck: Secondary | ICD-10-CM

## 2024-01-16 ENCOUNTER — Other Ambulatory Visit (HOSPITAL_COMMUNITY): Payer: Self-pay

## 2024-01-17 ENCOUNTER — Other Ambulatory Visit (HOSPITAL_COMMUNITY): Payer: Self-pay

## 2024-01-20 ENCOUNTER — Ambulatory Visit: Admitting: Family Medicine

## 2024-01-20 ENCOUNTER — Encounter: Admitting: Family Medicine

## 2024-01-20 ENCOUNTER — Other Ambulatory Visit (HOSPITAL_COMMUNITY): Payer: Self-pay

## 2024-01-20 VITALS — BP 124/80 | HR 74 | Ht 72.0 in | Wt 274.0 lb

## 2024-01-20 DIAGNOSIS — M7711 Lateral epicondylitis, right elbow: Secondary | ICD-10-CM | POA: Diagnosis not present

## 2024-01-20 DIAGNOSIS — R948 Abnormal results of function studies of other organs and systems: Secondary | ICD-10-CM

## 2024-01-20 DIAGNOSIS — R632 Polyphagia: Secondary | ICD-10-CM | POA: Diagnosis not present

## 2024-01-20 DIAGNOSIS — E118 Type 2 diabetes mellitus with unspecified complications: Secondary | ICD-10-CM | POA: Diagnosis not present

## 2024-01-20 DIAGNOSIS — K760 Fatty (change of) liver, not elsewhere classified: Secondary | ICD-10-CM

## 2024-01-20 DIAGNOSIS — Z6837 Body mass index (BMI) 37.0-37.9, adult: Secondary | ICD-10-CM

## 2024-01-20 DIAGNOSIS — E66812 Obesity, class 2: Secondary | ICD-10-CM

## 2024-01-20 DIAGNOSIS — Z7985 Long-term (current) use of injectable non-insulin antidiabetic drugs: Secondary | ICD-10-CM

## 2024-01-20 DIAGNOSIS — Z23 Encounter for immunization: Secondary | ICD-10-CM

## 2024-01-20 MED ORDER — PHENTERMINE HCL 37.5 MG PO TABS
37.5000 mg | ORAL_TABLET | Freq: Every day | ORAL | 0 refills | Status: AC
  Filled 2024-01-20: qty 90, 90d supply, fill #0

## 2024-01-20 MED ORDER — MOUNJARO 12.5 MG/0.5ML ~~LOC~~ SOAJ
12.5000 mg | SUBCUTANEOUS | 3 refills | Status: AC
  Filled 2024-01-20 – 2024-01-27 (×2): qty 6, 84d supply, fill #0

## 2024-01-25 ENCOUNTER — Telehealth (HOSPITAL_COMMUNITY): Payer: Self-pay

## 2024-01-25 ENCOUNTER — Other Ambulatory Visit (HOSPITAL_COMMUNITY): Payer: Self-pay

## 2024-01-27 ENCOUNTER — Other Ambulatory Visit (HOSPITAL_COMMUNITY): Payer: Self-pay

## 2024-01-27 ENCOUNTER — Telehealth (HOSPITAL_COMMUNITY): Payer: Self-pay

## 2024-01-27 NOTE — Telephone Encounter (Signed)
 Pharmacy Patient Advocate Encounter  Received notification from [employer] that Prior Authorization for Mounjaro  12.5MG /0.5ML auto-injectors  has been APPROVED from 01/27/24 to 01/26/25. Ran test claim, Copay is $30. This test claim was processed through [facility]- copay amounts may vary at other pharmacies due to pharmacy/plan contracts, or as the patient moves through the different stages of their insurance plan.   PA #/Case ID/Reference #: [id number]

## 2024-01-27 NOTE — Telephone Encounter (Signed)
 PA request has been Received. New Encounter has been or will be created for follow up. For additional info see Pharmacy Prior Auth telephone encounter from 01/27/24.

## 2024-01-27 NOTE — Telephone Encounter (Signed)
 Pharmacy Patient Advocate Encounter   Received notification from Pt Calls Messages that prior authorization for Mounjaro  12.5MG /0.5ML auto-injectors  is required/requested.   Insurance verification completed.   The patient is insured through [facility].   Per test claim: PA required; PA submitted to above mentioned insurance via Latent Key/confirmation #/EOC [id number] Status is pending

## 2024-01-28 ENCOUNTER — Encounter: Payer: Self-pay | Admitting: Family Medicine

## 2024-01-28 DIAGNOSIS — K760 Fatty (change of) liver, not elsewhere classified: Secondary | ICD-10-CM | POA: Insufficient documentation

## 2024-01-28 DIAGNOSIS — Z6837 Body mass index (BMI) 37.0-37.9, adult: Secondary | ICD-10-CM | POA: Insufficient documentation

## 2024-01-28 DIAGNOSIS — R632 Polyphagia: Secondary | ICD-10-CM | POA: Insufficient documentation

## 2024-01-28 DIAGNOSIS — R948 Abnormal results of function studies of other organs and systems: Secondary | ICD-10-CM | POA: Insufficient documentation

## 2024-01-28 NOTE — Progress Notes (Signed)
 Assessment & Plan:   1. Polyphagia (Primary) Comments: Controlled. Continue Mounjaro  and phentermine . Reviewed the importance of protein intake. - phentermine  (ADIPEX-P ) 37.5 MG tablet; Take 1 tablet (37.5 mg total) by mouth daily before breakfast.  Dispense: 90 tablet; Refill: 0  2. Type 2 diabetes mellitus with complication, without long-term current use of insulin  (HCC) Comments: At goal. Continue Mounjaro . - tirzepatide  (MOUNJARO ) 12.5 MG/0.5ML Pen; Inject 12.5 mg into the skin once a week.  Dispense: 6 mL; Refill: 3  3. Immunization due - Flu vaccine trivalent PF, 6mos and older(Flulaval,Afluria,Fluarix,Fluzone)  4. Right lateral epicondylitis Comments: Overuse tendinopathy. Start RICE: activity modification, ice 10-15 min BID, avoid repetitive gripping/lifting. OTC NSAID or topical diclofenac PRN if tolerated.  5. Metabolic dysfunction-associated steatotic liver disease (MASLD) Comments: Will continue to focus on weight loss and GLP1RA.  6. Abnormal metabolism, slow, Dx via Indirect Calorimeter  7. Class 2 severe obesity with serious comorbidity and body mass index (BMI) of 37.0 to 37.9 in adult, unspecified obesity type Comments: Improving.  Francisco Shutter, DO, MS, FAAFP, Dipl. [facility] [address] [place], [address] Dept: [phone] Dept Fax: [phone]  Subjective:   Patient is well known to me from my previous clinic and is now establishing care with me as their primary care provider.  Past medical history, surgical history, allergies, family history, immunizations andmedications were updated in the EMR today and reviewed under the history and medication portions of their EMR. Review of Systems: Negative, with the exception of above mentioned in HPI.  Current Outpatient Medications:    ascorbic acid (VITAMIN C) 500 MG tablet, Take 500 mg by mouth daily., Disp: , Rfl:    Cholecalciferol  (VITAMIN D ) 50 MCG  (2000 UT) CAPS, Take 1 capsule by mouth daily., Disp: , Rfl:    Cyanocobalamin  (VITAMIN B-12) 1000 MCG SUBL, Place 1 tablet (1,000 mcg total) under the tongue daily., Disp: 90 tablet, Rfl: 3   Multiple Vitamins-Minerals (MULTI ADULT GUMMIES) CHEW, Chew 1 Units by mouth 2 (two) times daily., Disp: , Rfl:    Zinc 50 MG TABS, Take 50 mg by mouth daily., Disp: , Rfl:    phentermine  (ADIPEX-P ) 37.5 MG tablet, Take 1 tablet (37.5 mg total) by mouth daily before breakfast., Disp: 90 tablet, Rfl: 0   tirzepatide  (MOUNJARO ) 12.5 MG/0.5ML Pen, Inject 12.5 mg into the skin once a week., Disp: 6 mL, Rfl: 3   Objective:   BP 124/80 (BP Location: Left Arm, Cuff Size: Large)   Pulse 74   Ht 6' (1.829 m)   Wt 274 lb (124.3 kg)   SpO2 98%   BMI 37.16 kg/m   Wt Readings from Last 3 Encounters:  01/20/24 274 lb (124.3 kg)  09/08/23 273 lb (123.8 kg)  06/22/22 300 lb (136.1 kg)   Physical Exam Constitutional:      General: He is not in acute distress.    Appearance: He is well-developed. He is obese.  HENT:     Head: Normocephalic and atraumatic.  Eyes:     Conjunctiva/sclera: Conjunctivae normal.  Cardiovascular:     Rate and Rhythm: Normal rate and regular rhythm.     Heart sounds: Normal heart sounds.  Pulmonary:     Effort: Pulmonary effort is normal.     Breath sounds: Normal breath sounds.  Musculoskeletal:     Cervical back: Normal range of motion and neck supple.     Comments: No deformity or swelling. TTP over lateral epicondyle/extensor  tendon. Full ROM. Pain with resisted wrist extension. Strength intact. Sensation and pulses normal.  Neurological:     General: No focal deficit present.     Mental Status: He is alert and oriented to person, place, and time.  Psychiatric:        Mood and Affect: Mood normal.        Behavior: Behavior normal.    Note: Other outside labs reviewed and will be abstracted. Below labs are the most recent in local EPIC.  Lab Results  Component Value  Date   CREATININE 0.90 06/25/2022   BUN 12 06/25/2022   NA 141 06/25/2022   K 4.2 06/25/2022   CL 105 06/25/2022   CO2 25 06/25/2022   Lab Results  Component Value Date   ALT 20 06/25/2022   AST 16 06/25/2022   ALKPHOS 107 06/25/2022   BILITOT 0.4 06/25/2022   Lab Results  Component Value Date   HGBA1C 5.7 06/25/2022   HGBA1C 6.2 02/13/2021   HGBA1C 6.2 08/06/2020   HGBA1C 6.0 (H) 11/14/2019   HGBA1C 6.5 04/16/2019   No results found for: INSULIN  Lab Results  Component Value Date   TSH 0.66 06/25/2022   Lab Results  Component Value Date   CHOL 127 06/25/2022   HDL 31.70 (L) 06/25/2022   LDLCALC 80 06/25/2022   TRIG 74.0 06/25/2022   CHOLHDL 4 06/25/2022   Lab Results  Component Value Date   VD25OH 41.26 06/25/2022   VD25OH 20.34 (L) 02/13/2021   VD25OH 21.55 (L) 08/06/2020   Lab Results  Component Value Date   WBC 4.7 06/25/2022   HGB 14.4 06/25/2022   HCT 42.9 06/25/2022   MCV 87.1 06/25/2022   PLT 258.0 06/25/2022

## 2024-02-17 ENCOUNTER — Ambulatory Visit: Admitting: Family Medicine

## 2024-02-22 ENCOUNTER — Ambulatory Visit: Admitting: Family Medicine

## 2024-02-22 VITALS — BP 114/76 | HR 61 | Ht 72.0 in | Wt 270.4 lb

## 2024-02-22 DIAGNOSIS — R632 Polyphagia: Secondary | ICD-10-CM | POA: Diagnosis not present

## 2024-02-22 DIAGNOSIS — E66812 Obesity, class 2: Secondary | ICD-10-CM | POA: Diagnosis not present

## 2024-02-22 DIAGNOSIS — Z6837 Body mass index (BMI) 37.0-37.9, adult: Secondary | ICD-10-CM | POA: Diagnosis not present

## 2024-02-22 DIAGNOSIS — E118 Type 2 diabetes mellitus with unspecified complications: Secondary | ICD-10-CM

## 2024-02-22 DIAGNOSIS — Z7985 Long-term (current) use of injectable non-insulin antidiabetic drugs: Secondary | ICD-10-CM | POA: Diagnosis not present

## 2024-02-22 DIAGNOSIS — K760 Fatty (change of) liver, not elsewhere classified: Secondary | ICD-10-CM | POA: Diagnosis not present

## 2024-02-22 NOTE — Progress Notes (Unsigned)
" ° °  Weight Management:   Starting weight: 422 lbs Starting date: 08/03/2020 Today's weight: 270 lbs Today's date: 02/23/2024 Total lbs lost to date: 152 lbs Total lbs lost since last in-office visit: -4 lbs Total weight loss percentage to date: -36.02% Body mass index is 36.67 kg/m.  "

## 2024-02-23 ENCOUNTER — Encounter: Payer: Self-pay | Admitting: Family Medicine

## 2024-02-23 NOTE — Assessment & Plan Note (Addendum)
 Starting weight: 422 lbs Starting date: 08/03/2020 Today's weight: 270 lbs Today's date: 02/23/2024 Total lbs lost to date: 152 lbs Total lbs lost since last in-office visit: -4 lbs Total weight loss percentage to date: -36.02%

## 2024-02-23 NOTE — Assessment & Plan Note (Signed)
 Effective Strategies to Manage Hunger: Maintain a Consistent Eating Schedule - Eating meals at regular intervals helps regulate hunger hormones and prevents extreme hunger that may lead to overeating.  Prioritize Nutrient-Dense Foods - Focus on meals rich in protein, fiber, and healthy fats to promote satiety. Incorporate lean proteins, whole grains, vegetables, fruits, and healthy fats like nuts and avocados into your diet.  Stay Hydrated - Dehydration can sometimes be mistaken for hunger. Drinking enough water throughout the day can help curb unnecessary cravings.  Incorporate Protein in Every Meal - Protein slows digestion and helps keep you full for longer. Include sources like eggs, chicken, fish, tofu, beans, or Greek yogurt in your meals.  Increase Fiber Intake - Fiber-rich foods add bulk to meals and support digestion, keeping you fuller for longer. Opt for whole grains, vegetables, legumes, and fruits to boost fiber consumption.  Practice Mindful Eating - Slow down and savor each bite, paying attention to hunger signals and eating until comfortably full rather than stuffed.

## 2024-02-23 NOTE — Assessment & Plan Note (Signed)
 Diabetes Mellitus: At goal. Medication: Mounjaro . Issues reviewed: blood sugar goals, complications of diabetes mellitus, exercise, and nutrition.  Plan: Well controlled.  No signs of complications, medication side effects, or red flags.  Continue current regimen.

## 2024-02-23 NOTE — Assessment & Plan Note (Addendum)
 Treatment goal: 7-10% reduction of body weight.  Aerobic and resistance physical activity are important to help achieve a healthy body weight BUT also increases peripheral insulin  sensitivity, reducing delivery of free fatty acids and glucose to the liver. Exercise also increases intrahepatic fatty acid oxidation, decreasing fatty acid synthesis, and helps prevent mitochondrial and hepatocellular damage. Medications that can help reduce hepatic fat include metformin and GLP1RAs.

## 2024-02-23 NOTE — Progress Notes (Signed)
 "  Weight Management:   Starting weight: 422 lbs Starting date: 08/03/2020 Today's weight: 270 lbs Today's date: 02/23/2024 Total lbs lost to date: 152 lbs Total lbs lost since last in-office visit: -4 lbs Total weight loss percentage to date: -36.02% Body mass index is 36.67 kg/m.   Nutrition Plan: 1800 calories/95 g protein.  Adherence to Nutrition Plan: good.  Current Anti-Obesity Medication, including off-label: Mounjaro  12.5 mg Calion weekly & Phentermine  37.5mg  1 tab PO every day. SE: none.  Recent Physical Activity: walking for approx 20 minutes, 3 days per week.  Subjective:   Review of Systems: Negative, with the exception of above mentioned in HPI.  History:   Reviewed by clinician on day of visit: allergies, medications, problem list, medical history, surgical history, family history, social history, and previous encounter notes.  Medications:   Show/hide medication list[1] Allergies[2]  Objective:   BP 114/76 (BP Location: Left Arm, Cuff Size: Large)   Pulse 61   Ht 6' (1.829 m)   Wt 270 lb 6.4 oz (122.7 kg)   SpO2 99%   BMI 36.67 kg/m   Physical Exam Constitutional:      General: He is not in acute distress.    Appearance: He is well-developed.  HENT:     Head: Normocephalic and atraumatic.  Eyes:     Conjunctiva/sclera: Conjunctivae normal.  Cardiovascular:     Rate and Rhythm: Normal rate and regular rhythm.     Heart sounds: Normal heart sounds.  Pulmonary:     Effort: Pulmonary effort is normal.     Breath sounds: Normal breath sounds.  Neurological:     General: No focal deficit present.     Mental Status: He is alert.  Psychiatric:        Behavior: Behavior normal.    Assessment & Plan:   Metabolic dysfunction-associated steatotic liver disease (MASLD) Treatment goal: 7-10% reduction of body weight.  Aerobic and resistance physical activity are important to help achieve a healthy body weight BUT also increases peripheral insulin   sensitivity, reducing delivery of free fatty acids and glucose to the liver. Exercise also increases intrahepatic fatty acid oxidation, decreasing fatty acid synthesis, and helps prevent mitochondrial and hepatocellular damage. Medications that can help reduce hepatic fat include metformin and GLP1RAs.   Polyphagia Effective Strategies to Manage Hunger: Maintain a Consistent Eating Schedule - Eating meals at regular intervals helps regulate hunger hormones and prevents extreme hunger that may lead to overeating.  Prioritize Nutrient-Dense Foods - Focus on meals rich in protein, fiber, and healthy fats to promote satiety. Incorporate lean proteins, whole grains, vegetables, fruits, and healthy fats like nuts and avocados into your diet.  Stay Hydrated - Dehydration can sometimes be mistaken for hunger. Drinking enough water throughout the day can help curb unnecessary cravings.  Incorporate Protein in Every Meal - Protein slows digestion and helps keep you full for longer. Include sources like eggs, chicken, fish, tofu, beans, or Greek yogurt in your meals.  Increase Fiber Intake - Fiber-rich foods add bulk to meals and support digestion, keeping you fuller for longer. Opt for whole grains, vegetables, legumes, and fruits to boost fiber consumption.  Practice Mindful Eating - Slow down and savor each bite, paying attention to hunger signals and eating until comfortably full rather than stuffed.   Class 2 severe obesity with serious comorbidity and body mass index (BMI) of 37.0 to 37.9 in adult Starting weight: 422 lbs Starting date: 08/03/2020 Today's weight: 270 lbs Today's date: 02/23/2024 Total  lbs lost to date: 152 lbs Total lbs lost since last in-office visit: -4 lbs Total weight loss percentage to date: -36.02%  Type 2 diabetes mellitus with complication, without long-term current use of insulin  (HCC) Diabetes Mellitus: At goal. Medication: Mounjaro . Issues reviewed: blood sugar goals,  complications of diabetes mellitus, exercise, and nutrition.  Plan: Well controlled.  No signs of complications, medication side effects, or red flags.  Continue current regimen.    Geni Shutter, DO, MS, FAAFP, Dipl. [facility] at [facility] [address] [place], [address] Dept: [phone] Dept Fax: [phone]      [1]  Outpatient Medications Prior to Visit  Medication Sig   ascorbic acid (VITAMIN C) 500 MG tablet Take 500 mg by mouth daily.   Cholecalciferol  (VITAMIN D ) 50 MCG (2000 UT) CAPS Take 1 capsule by mouth daily.   Cyanocobalamin  (VITAMIN B-12) 1000 MCG SUBL Place 1 tablet (1,000 mcg total) under the tongue daily.   Multiple Vitamins-Minerals (MULTI ADULT GUMMIES) CHEW Chew 1 Units by mouth 2 (two) times daily.   phentermine  (ADIPEX-P ) 37.5 MG tablet Take 1 tablet (37.5 mg total) by mouth daily before breakfast.   tirzepatide  (MOUNJARO ) 12.5 MG/0.5ML Pen Inject 12.5 mg into the skin once a week.   Zinc 50 MG TABS Take 50 mg by mouth daily.   No facility-administered medications prior to visit.  [2]  Allergies Allergen Reactions   Nsaids     Hives    "

## 2024-03-07 ENCOUNTER — Telehealth: Payer: Self-pay

## 2024-03-07 NOTE — Telephone Encounter (Signed)
 Vm full, unable to leave message. Sent MyChart message to let pt know we are needing to reschedule his TOC appt.   E2C2, when pt calls back, please transfer to front office for rescheduling -kh

## 2024-03-16 ENCOUNTER — Ambulatory Visit: Admitting: Family Medicine

## 2024-03-16 ENCOUNTER — Encounter: Payer: Self-pay | Admitting: Family Medicine

## 2024-03-16 VITALS — BP 122/74 | HR 76 | Ht 72.0 in | Wt 268.0 lb

## 2024-03-16 DIAGNOSIS — K760 Fatty (change of) liver, not elsewhere classified: Secondary | ICD-10-CM

## 2024-03-16 DIAGNOSIS — E559 Vitamin D deficiency, unspecified: Secondary | ICD-10-CM

## 2024-03-16 DIAGNOSIS — E538 Deficiency of other specified B group vitamins: Secondary | ICD-10-CM | POA: Insufficient documentation

## 2024-03-16 DIAGNOSIS — E66812 Obesity, class 2: Secondary | ICD-10-CM

## 2024-03-16 DIAGNOSIS — R632 Polyphagia: Secondary | ICD-10-CM

## 2024-03-16 DIAGNOSIS — E786 Lipoprotein deficiency: Secondary | ICD-10-CM | POA: Insufficient documentation

## 2024-03-16 DIAGNOSIS — E118 Type 2 diabetes mellitus with unspecified complications: Secondary | ICD-10-CM

## 2024-03-16 LAB — LIPID PANEL
Cholesterol: 143 mg/dL (ref 28–200)
HDL: 42.5 mg/dL
LDL Cholesterol: 90 mg/dL (ref 10–99)
NonHDL: 100.84
Total CHOL/HDL Ratio: 3
Triglycerides: 53 mg/dL (ref 10.0–149.0)
VLDL: 10.6 mg/dL (ref 0.0–40.0)

## 2024-03-16 LAB — CBC WITH DIFFERENTIAL/PLATELET
Basophils Absolute: 0 K/uL (ref 0.0–0.1)
Basophils Relative: 0.7 % (ref 0.0–3.0)
Eosinophils Absolute: 0 K/uL (ref 0.0–0.7)
Eosinophils Relative: 0.4 % (ref 0.0–5.0)
HCT: 43.5 % (ref 39.0–52.0)
Hemoglobin: 14.9 g/dL (ref 13.0–17.0)
Lymphocytes Relative: 30.3 % (ref 12.0–46.0)
Lymphs Abs: 1.5 K/uL (ref 0.7–4.0)
MCHC: 34.3 g/dL (ref 30.0–36.0)
MCV: 86 fl (ref 78.0–100.0)
Monocytes Absolute: 0.4 K/uL (ref 0.1–1.0)
Monocytes Relative: 8.9 % (ref 3.0–12.0)
Neutro Abs: 3 K/uL (ref 1.4–7.7)
Neutrophils Relative %: 59.7 % (ref 43.0–77.0)
Platelets: 236 K/uL (ref 150.0–400.0)
RBC: 5.06 Mil/uL (ref 4.22–5.81)
RDW: 12.9 % (ref 11.5–15.5)
WBC: 5 K/uL (ref 4.0–10.5)

## 2024-03-16 LAB — COMPREHENSIVE METABOLIC PANEL WITH GFR
ALT: 13 U/L (ref 3–53)
AST: 13 U/L (ref 5–37)
Albumin: 4.6 g/dL (ref 3.5–5.2)
Alkaline Phosphatase: 82 U/L (ref 39–117)
BUN: 14 mg/dL (ref 6–23)
CO2: 31 meq/L (ref 19–32)
Calcium: 9.8 mg/dL (ref 8.4–10.5)
Chloride: 102 meq/L (ref 96–112)
Creatinine, Ser: 1 mg/dL (ref 0.40–1.50)
GFR: 89.09 mL/min
Glucose, Bld: 84 mg/dL (ref 70–99)
Potassium: 4.8 meq/L (ref 3.5–5.1)
Sodium: 140 meq/L (ref 135–145)
Total Bilirubin: 0.7 mg/dL (ref 0.2–1.2)
Total Protein: 7.9 g/dL (ref 6.0–8.3)

## 2024-03-16 LAB — HEMOGLOBIN A1C: Hgb A1c MFr Bld: 5.4 % (ref 4.6–6.5)

## 2024-03-16 LAB — VITAMIN B12: Vitamin B-12: 121 pg/mL — ABNORMAL LOW (ref 211–911)

## 2024-03-16 LAB — VITAMIN D 25 HYDROXY (VIT D DEFICIENCY, FRACTURES): VITD: 35.47 ng/mL (ref 30.00–100.00)

## 2024-03-16 NOTE — Progress Notes (Addendum)
 "   Patient Care Team: Francisco Frieze, DO as PCP - General (Family Medicine) [facility], [facility] (Optometry)   Weight Management:   Starting weight: 422 lbs Starting date: 08/03/2020 Today's weight: 268 lbs Today's date: 02/23/2024 Total lbs lost to date: 154 lbs Total lbs lost since last in-office visit: 2 lbs Total weight loss percentage to date: -36.49% Body mass index is 36.67 kg/m.  Nutrition Plan: 1800 calories/95 g protein.  Adherence to Nutrition Plan: good.  Current Anti-Obesity Medication, including off-label: Mounjaro  12.5 mg Pritchett weekly & Phentermine  37.5mg  1 tab PO every day. SE: none.  Recent Physical Activity: walking for approx 20 minutes, 3 days per week.   Patient is under the care of an Obesity Medicine-certified physician providing longitudinal care using a comprehensive, pillar-based obesity treatment model (nutrition therapy, physical activity counseling, behavioral modification, pharmacotherapy when indicated, and medical monitoring), consistent with standards outlined by the Obesity Medicine Association. Obesity is treated as a medical disease, not a cosmetic condition. Weight, BMI, waist circumference, blood pressure, relevant metabolic markers (as applicable), medication tolerability, and adherence will be monitored at regular follow-up intervals.  Assessment & Plan:   1. Type 2 diabetes mellitus with complication, without long-term current use of insulin  (HCC) (Primary) - CBC with Differential/Platelet - Comprehensive metabolic panel with GFR - Hemoglobin A1c - Lipid panel  2. Metabolic dysfunction-associated steatotic liver disease (MASLD)  3. B12 deficiency - Vitamin B12  4. Low HDL (under 40) - Lipid panel  5. Vitamin D  deficiency - VITAMIN D  25 Hydroxy (Vit-D Deficiency, Fractures)  6. Class 2 severe obesity with serious comorbidity and body mass index (BMI) of 36.0 to 36.9 in adult, unspecified obesity type See Weight Management at top for  intervention.   Hamilton Prentiss, DO, MS, FAAFP, Dipl. [facility] [address] [place], [address] Dept: [phone] Dept Fax: [phone]  Subjective:   Review of Systems: Negative, with the exception of above mentioned in HPI.  History:   Reviewed by clinician on day of visit: allergies, medications, problem list, medical history, surgical history, family history, social history, and previous encounter notes.  Medications:   Show/hide medication list[1] Allergies[2]  Objective:   BP 122/74 (BP Location: Right Arm, Cuff Size: Large)   Pulse 76   Ht 6' (1.829 m)   Wt 268 lb (121.6 kg)   SpO2 97%   BMI 36.35 kg/m   Physical Exam Constitutional:      General: He is not in acute distress.    Appearance: He is well-developed.  HENT:     Head: Normocephalic and atraumatic.  Eyes:     Conjunctiva/sclera: Conjunctivae normal.  Cardiovascular:     Rate and Rhythm: Normal rate and regular rhythm.     Heart sounds: Normal heart sounds.  Pulmonary:     Effort: Pulmonary effort is normal.     Breath sounds: Normal breath sounds.  Neurological:     General: No focal deficit present.     Mental Status: He is alert.  Psychiatric:        Behavior: Behavior normal.    Attestations:   Chart updated today with reconciliation of problem list, medications, allergies, and relevant history. Preventive care and chronic disease status reviewed.  As the patient's primary care physician, board-certified in Family Medicine and Obesity Medicine, I am providing ongoing, comprehensive obesity care based on the pillars of obesity medicine, including nutrition therapy, physical activity, behavioral modification, and pharmacologic treatment.     [  1]  Outpatient Medications Prior to Visit  Medication Sig   ascorbic acid (VITAMIN C) 500 MG tablet Take 500 mg by mouth daily.   Cholecalciferol  (VITAMIN D ) 50 MCG (2000 UT) CAPS Take 1 capsule  by mouth daily.   Cyanocobalamin  (VITAMIN B-12) 1000 MCG SUBL Place 1 tablet (1,000 mcg total) under the tongue daily.   Multiple Vitamins-Minerals (MULTI ADULT GUMMIES) CHEW Chew 1 Units by mouth 2 (two) times daily.   phentermine  (ADIPEX-P ) 37.5 MG tablet Take 1 tablet (37.5 mg total) by mouth daily before breakfast.   tirzepatide  (MOUNJARO ) 12.5 MG/0.5ML Pen Inject 12.5 mg into the skin once a week.   Zinc 50 MG TABS Take 50 mg by mouth daily.   No facility-administered medications prior to visit.  [2]  Allergies Allergen Reactions   Nsaids     Hives    "

## 2024-03-19 ENCOUNTER — Ambulatory Visit: Payer: Self-pay | Admitting: Family Medicine

## 2024-03-19 NOTE — Telephone Encounter (Signed)
 I have called pt and there was no answer. I have left a message asking pt to let us  know if his is interested in starting B12 injections. I will also send him a [employer].

## 2024-03-23 NOTE — Telephone Encounter (Signed)
 Noted.

## 2024-04-09 ENCOUNTER — Encounter

## 2024-04-16 ENCOUNTER — Encounter

## 2024-04-17 ENCOUNTER — Ambulatory Visit: Admitting: Family Medicine

## 2024-05-14 ENCOUNTER — Ambulatory Visit: Admitting: Family Medicine
